# Patient Record
Sex: Female | Born: 1950 | Race: White | Hispanic: No | Smoking: Former smoker
Health system: Southern US, Community
[De-identification: ages and names within clinical notes are randomized; demographics above are authoritative.]

## PROBLEM LIST (undated history)

## (undated) DIAGNOSIS — C3490 Malignant neoplasm of unspecified part of unspecified bronchus or lung: Secondary | ICD-10-CM

## (undated) DIAGNOSIS — I10 Essential (primary) hypertension: Secondary | ICD-10-CM

## (undated) DIAGNOSIS — Z7901 Long term (current) use of anticoagulants: Secondary | ICD-10-CM

## (undated) DIAGNOSIS — R9430 Abnormal result of cardiovascular function study, unspecified: Secondary | ICD-10-CM

## (undated) DIAGNOSIS — J189 Pneumonia, unspecified organism: Secondary | ICD-10-CM

## (undated) DIAGNOSIS — J449 Chronic obstructive pulmonary disease, unspecified: Secondary | ICD-10-CM

## (undated) DIAGNOSIS — T7840XA Allergy, unspecified, initial encounter: Secondary | ICD-10-CM

## (undated) DIAGNOSIS — R918 Other nonspecific abnormal finding of lung field: Secondary | ICD-10-CM

## (undated) DIAGNOSIS — I82409 Acute embolism and thrombosis of unspecified deep veins of unspecified lower extremity: Secondary | ICD-10-CM

## (undated) DIAGNOSIS — I4891 Unspecified atrial fibrillation: Secondary | ICD-10-CM

## (undated) DIAGNOSIS — I2699 Other pulmonary embolism without acute cor pulmonale: Secondary | ICD-10-CM

## (undated) DIAGNOSIS — I313 Pericardial effusion (noninflammatory): Secondary | ICD-10-CM

## (undated) DIAGNOSIS — I3139 Other pericardial effusion (noninflammatory): Secondary | ICD-10-CM

## (undated) DIAGNOSIS — C719 Malignant neoplasm of brain, unspecified: Secondary | ICD-10-CM

## (undated) DIAGNOSIS — C801 Malignant (primary) neoplasm, unspecified: Secondary | ICD-10-CM

## (undated) DIAGNOSIS — J8410 Pulmonary fibrosis, unspecified: Secondary | ICD-10-CM

## (undated) HISTORY — DX: Pericardial effusion (noninflammatory): I31.3

## (undated) HISTORY — DX: Long term (current) use of anticoagulants: Z79.01

## (undated) HISTORY — DX: Malignant (primary) neoplasm, unspecified: C80.1

## (undated) HISTORY — DX: Allergy, unspecified, initial encounter: T78.40XA

## (undated) HISTORY — DX: Malignant neoplasm of brain, unspecified: C71.9

## (undated) HISTORY — PX: CHOLECYSTECTOMY: SHX55

## (undated) HISTORY — DX: Other pericardial effusion (noninflammatory): I31.39

## (undated) HISTORY — PX: APPENDECTOMY: SHX54

## (undated) HISTORY — DX: Malignant neoplasm of unspecified part of unspecified bronchus or lung: C34.90

---

## 2006-09-30 DIAGNOSIS — I4891 Unspecified atrial fibrillation: Secondary | ICD-10-CM

## 2006-09-30 HISTORY — DX: Unspecified atrial fibrillation: I48.91

## 2008-03-21 ENCOUNTER — Ambulatory Visit: Payer: Self-pay | Admitting: Cardiology

## 2008-10-06 ENCOUNTER — Ambulatory Visit: Payer: Self-pay | Admitting: Cardiology

## 2008-10-10 ENCOUNTER — Encounter: Payer: Self-pay | Admitting: Cardiology

## 2008-10-16 ENCOUNTER — Encounter: Payer: Self-pay | Admitting: Cardiology

## 2008-10-28 ENCOUNTER — Ambulatory Visit: Payer: Self-pay | Admitting: Cardiology

## 2008-11-11 ENCOUNTER — Ambulatory Visit: Payer: Self-pay | Admitting: Cardiology

## 2008-11-22 ENCOUNTER — Encounter: Payer: Self-pay | Admitting: Cardiology

## 2008-11-30 ENCOUNTER — Encounter: Payer: Self-pay | Admitting: Cardiology

## 2008-12-05 ENCOUNTER — Encounter: Payer: Self-pay | Admitting: Cardiology

## 2009-01-14 ENCOUNTER — Encounter: Payer: Self-pay | Admitting: Cardiology

## 2009-02-08 ENCOUNTER — Encounter: Payer: Self-pay | Admitting: Cardiology

## 2009-02-12 ENCOUNTER — Ambulatory Visit: Payer: Self-pay | Admitting: Cardiology

## 2009-02-12 ENCOUNTER — Encounter: Payer: Self-pay | Admitting: Cardiology

## 2009-02-17 ENCOUNTER — Encounter: Payer: Self-pay | Admitting: Cardiology

## 2009-02-22 ENCOUNTER — Ambulatory Visit: Payer: Self-pay | Admitting: Cardiology

## 2009-02-22 ENCOUNTER — Encounter: Payer: Self-pay | Admitting: Cardiology

## 2009-03-15 ENCOUNTER — Encounter: Payer: Self-pay | Admitting: Cardiology

## 2009-05-26 ENCOUNTER — Ambulatory Visit: Payer: Self-pay | Admitting: Cardiology

## 2009-08-25 ENCOUNTER — Encounter: Payer: Self-pay | Admitting: Cardiology

## 2009-11-16 ENCOUNTER — Encounter: Payer: Self-pay | Admitting: Cardiology

## 2009-11-23 ENCOUNTER — Ambulatory Visit: Payer: Self-pay | Admitting: Cardiology

## 2009-11-24 ENCOUNTER — Encounter: Payer: Self-pay | Admitting: Cardiology

## 2009-12-01 ENCOUNTER — Ambulatory Visit: Payer: Self-pay | Admitting: Cardiology

## 2009-12-18 ENCOUNTER — Telehealth (INDEPENDENT_AMBULATORY_CARE_PROVIDER_SITE_OTHER): Payer: Self-pay | Admitting: *Deleted

## 2009-12-29 ENCOUNTER — Ambulatory Visit: Payer: Self-pay | Admitting: Cardiology

## 2010-01-16 ENCOUNTER — Ambulatory Visit: Payer: Self-pay | Admitting: Cardiology

## 2010-02-16 ENCOUNTER — Ambulatory Visit: Payer: Self-pay | Admitting: Cardiology

## 2010-03-30 ENCOUNTER — Ambulatory Visit: Payer: Self-pay | Admitting: Cardiology

## 2010-03-30 LAB — CONVERTED CEMR LAB: POC INR: 1.7

## 2010-04-17 ENCOUNTER — Ambulatory Visit: Payer: Self-pay | Admitting: Cardiology

## 2010-04-17 LAB — CONVERTED CEMR LAB: POC INR: 4.8

## 2010-04-28 ENCOUNTER — Encounter: Payer: Self-pay | Admitting: Cardiology

## 2010-05-01 ENCOUNTER — Encounter: Payer: Self-pay | Admitting: Cardiology

## 2010-05-05 ENCOUNTER — Encounter: Payer: Self-pay | Admitting: Cardiology

## 2010-05-08 ENCOUNTER — Ambulatory Visit: Payer: Self-pay | Admitting: Cardiology

## 2010-05-08 LAB — CONVERTED CEMR LAB: POC INR: 1.9

## 2010-05-15 ENCOUNTER — Ambulatory Visit: Payer: Self-pay | Admitting: Cardiology

## 2010-05-15 LAB — CONVERTED CEMR LAB: POC INR: 2.2

## 2010-06-25 ENCOUNTER — Telehealth (INDEPENDENT_AMBULATORY_CARE_PROVIDER_SITE_OTHER): Payer: Self-pay | Admitting: *Deleted

## 2010-07-02 ENCOUNTER — Encounter: Payer: Self-pay | Admitting: Cardiology

## 2010-07-03 ENCOUNTER — Ambulatory Visit: Payer: Self-pay | Admitting: Cardiology

## 2010-07-03 DIAGNOSIS — Z87891 Personal history of nicotine dependence: Secondary | ICD-10-CM

## 2010-07-03 LAB — CONVERTED CEMR LAB: POC INR: 1.8

## 2010-07-27 ENCOUNTER — Ambulatory Visit: Payer: Self-pay | Admitting: Cardiology

## 2010-08-28 ENCOUNTER — Ambulatory Visit: Payer: Self-pay | Admitting: Cardiology

## 2010-09-18 ENCOUNTER — Ambulatory Visit: Payer: Self-pay | Admitting: Cardiology

## 2010-09-18 LAB — CONVERTED CEMR LAB: POC INR: 1.5

## 2010-09-30 DIAGNOSIS — I2699 Other pulmonary embolism without acute cor pulmonale: Secondary | ICD-10-CM

## 2010-09-30 HISTORY — DX: Other pulmonary embolism without acute cor pulmonale: I26.99

## 2010-10-02 ENCOUNTER — Ambulatory Visit: Admission: RE | Admit: 2010-10-02 | Discharge: 2010-10-02 | Payer: Self-pay | Source: Home / Self Care

## 2010-10-02 LAB — CONVERTED CEMR LAB: POC INR: 2.4

## 2010-10-03 ENCOUNTER — Telehealth: Payer: Self-pay | Admitting: Cardiology

## 2010-10-03 ENCOUNTER — Encounter: Payer: Self-pay | Admitting: Cardiology

## 2010-10-19 ENCOUNTER — Ambulatory Visit: Admission: RE | Admit: 2010-10-19 | Discharge: 2010-10-19 | Payer: Self-pay | Source: Home / Self Care

## 2010-10-19 LAB — CONVERTED CEMR LAB: POC INR: 1.5

## 2010-10-30 ENCOUNTER — Ambulatory Visit: Admit: 2010-10-30 | Payer: Self-pay

## 2010-10-30 NOTE — Medication Information (Signed)
Summary: ccr-lr  Anticoagulant Therapy  Managed by: Vashti Hey, RN PCP: Roswell Miners MD: Antoine Poche MD, Fayrene Fearing Indication 1: Atrial Fibrillation Indication 2: H/O Pulm Embolism Lab Used: [facility] [place] Site: [place] INR POC 2.2  Dietary changes: no    Health status changes: no    Bleeding/hemorrhagic complications: no    Recent/future hospitalizations: no    Any changes in medication regimen? no    Recent/future dental: no  Any missed doses?: no       Is patient compliant with meds? yes       Allergies: 1)  ! Codeine 2)  ! Erythromycin 3)  ! * Ambien  Anticoagulation Management History:      The patient is taking warfarin and comes in today for a routine follow up visit.  Negative risk factors for bleeding include an age less than 10 years old.  The bleeding index is 'low risk'.  Positive CHADS2 values include History of HTN.  Negative CHADS2 values include Age > 49 years old.  Anticoagulation responsible provider: Antoine Poche MD, Fayrene Fearing.  INR POC: 2.2.  Cuvette Lot#: [id number].    Anticoagulation Management Assessment/Plan:      The patient's current anticoagulation dose is Warfarin sodium 2 mg tabs: Use as directed by Anticoagualtion Clinic.  The target INR is 2.0-3.0.  The next INR is due 06/07/2010.  Anticoagulation instructions were given to patient.  Results were reviewed/authorized by Vashti Hey, RN.  She was notified by Vashti Hey RN.         Prior Anticoagulation Instructions: INR 1.9 Pt has been taking coumadin 5mg  since d/c from hospital Decrease dose to 2mg  once daily except 4mg  on T,Th,Sat  Current Anticoagulation Instructions: INR 2.2 Continue coumadin 2mg  once daily except 4mg  on T,Th,Sat Will have next INR check at Dr Myrtis Ser appt since she lives in [place]

## 2010-10-30 NOTE — Medication Information (Signed)
Summary: ccr-lr  Anticoagulant Therapy  Managed by: Vashti Hey, RN PCP: Louie Bun Supervising MD: Myrtis Ser MD, Tinnie Gens Indication 1: Atrial Fibrillation Indication 2: H/O Pulm Embolism Lab Used: [facility] [place] Site: [place] INR POC 2.2  Dietary changes: no    Health status changes: no    Bleeding/hemorrhagic complications: no    Recent/future hospitalizations: no    Any changes in medication regimen? no    Recent/future dental: no  Any missed doses?: no       Is patient compliant with meds? yes       Allergies: 1)  ! Codeine 2)  ! Erythromycin 3)  ! * Ambien  Anticoagulation Management History:      The patient is taking warfarin and comes in today for a routine follow up visit.  Negative risk factors for bleeding include an age less than 7 years old.  The bleeding index is 'low risk'.  Positive CHADS2 values include History of HTN.  Negative CHADS2 values include Age > 25 years old.  Anticoagulation responsible provider: Myrtis Ser MD, Tinnie Gens.  INR POC: 2.2.  Cuvette Lot#: [id number].    Anticoagulation Management Assessment/Plan:      The patient's current anticoagulation dose is Warfarin sodium 2 mg tabs: Use as directed by Anticoagualtion Clinic.  The target INR is 2.0-3.0.  The next INR is due 08/28/2010.  Anticoagulation instructions were given to patient.  Results were reviewed/authorized by Vashti Hey, RN.  She was notified by Vashti Hey RN.         Prior Anticoagulation Instructions: INR 1.8 Take coumadin 4mg  tomorrow then resume 2mg  once daily except 4mg  on T,Th,Sat  Current Anticoagulation Instructions: INR 2.2 Continue coumadin 2mg  once daily except 4mg  on T,Th, Sat

## 2010-10-30 NOTE — Progress Notes (Signed)
Summary: CCR  Phone Note Call from Patient Call back at [facility] Phone [phone]   Caller: Patient Reason for Call: Talk to Nurse Summary of Call: LEFT A MESSAGE WANTING [place] TO SCHEDULE HER A CCR. SHE HASNT HAD HER CCR CHECKED WITH [place] SINCE JULY Initial call taken by: Claudette Laws,  June 25, 2010 1:32 PM  Follow-up for Phone Call        Appt made 07/03/10 at 3pm. Follow-up by: Vashti Hey RN,  June 25, 2010 2:42 PM

## 2010-10-30 NOTE — Miscellaneous (Signed)
  Clinical Lists Changes  Observations: Added new observation of PAST MED HX: ATRIAL FIBRILLATION (ICD-427.31)..paroxysmal..Marland KitchenMarland KitchenCoumadin therapy Coumadin therapy HYPERTENSION, UNSPECIFIED (ICD-401.9) COPD.Marland Kitchensevere... CO2 retention... pulmonary fibrosis Diabetes esophageal stricture. Cholecystectomy Abdominal pain.Marland Kitchen after cholecystectomy Prednisone therapy.... in the past Pulmonary embolus... 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 Cholecystectomy Abdominal pain.Marland Kitchen after cholecystectomy Prednisone therapy.... in the past Pulmonary embolus... treated with Coumadin in the past EF  55-60%...2-D echo January, 2010 Pneumonia    hospital..Marland Kitchen7/30/2011

## 2010-10-30 NOTE — Medication Information (Signed)
Summary: ccr-lr  Anticoagulant Therapy  Managed by: Vashti Hey, RN PCP: Roswell Miners MD: Andee Lineman MD, Michelle Piper Indication 1: Atrial Fibrillation Indication 2: H/O Pulm Embolism Lab Used: [facility] [place] Site: [place] INR POC 4.8  Dietary changes: no    Health status changes: no    Bleeding/hemorrhagic complications: no    Recent/future hospitalizations: no    Any changes in medication regimen? no    Recent/future dental: no  Any missed doses?: no       Is patient compliant with meds? yes       Allergies: 1)  ! Codeine 2)  ! Erythromycin 3)  ! * Ambien  Anticoagulation Management History:      The patient is taking warfarin and comes in today for a routine follow up visit.  Negative risk factors for bleeding include an age less than 72 years old.  The bleeding index is 'low risk'.  Positive CHADS2 values include History of HTN.  Negative CHADS2 values include Age > 97 years old.  Anticoagulation responsible provider: Andee Lineman MD, Michelle Piper.  INR POC: 4.8.  Cuvette Lot#: [id number].    Anticoagulation Management Assessment/Plan:      The patient's current anticoagulation dose is Warfarin sodium 2 mg tabs: Use as directed by Anticoagualtion Clinic.  The target INR is 2.0-3.0.  The next INR is due 05/08/2010.  Anticoagulation instructions were given to patient.  Results were reviewed/authorized by Vashti Hey, RN.  She was notified by Vashti Hey RN.         Prior Anticoagulation Instructions: INR 1.7 Take coumadin 6mg  tonight and tomorrow night then resume 4mg  once daily except 2mg  on Sundays and Thursdays  Current Anticoagulation Instructions: INR 4.8 Hold coumadin tonight and tomorrow night then resume 4mg  once daily except 2mg  on Sundays and Thursdays

## 2010-10-30 NOTE — Medication Information (Signed)
Summary: ccr-lr  Anticoagulant Therapy  Managed by: Vashti Hey, RN PCP: Roswell Miners MD: Andee Lineman MD, Michelle Piper Indication 1: Atrial Fibrillation Indication 2: H/O Pulm Embolism Lab Used: [facility] [place] Site: [place] INR POC 1.9  Dietary changes: no    Health status changes: no    Bleeding/hemorrhagic complications: no    Recent/future hospitalizations: yes       Details: in [facility] from 04/28/10 - 05/05/10 for pneumonia  Any changes in medication regimen? yes       Details: On antibiotic 500mg  qid x 7 days  Received Levaquin and IV rocephin in hospital  Recent/future dental: no  Any missed doses?: yes     Details: coumadin was held in the hospital  At discharge she was told to take couamdin 5mg  qd   Is patient compliant with meds? yes       Allergies: 1)  ! Codeine 2)  ! Erythromycin 3)  ! * Ambien  Anticoagulation Management History:      The patient is taking warfarin and comes in today for a routine follow up visit.  Negative risk factors for bleeding include an age less than 41 years old.  The bleeding index is 'low risk'.  Positive CHADS2 values include History of HTN.  Negative CHADS2 values include Age > 54 years old.  Anticoagulation responsible provider: Andee Lineman MD, Michelle Piper.  INR POC: 1.9.  Cuvette Lot#: [id number].    Anticoagulation Management Assessment/Plan:      The patient's current anticoagulation dose is Warfarin sodium 2 mg tabs: Use as directed by Anticoagualtion Clinic.  The target INR is 2.0-3.0.  The next INR is due 05/15/2010.  Anticoagulation instructions were given to patient.  Results were reviewed/authorized by Vashti Hey, RN.  She was notified by Vashti Hey RN.         Prior Anticoagulation Instructions: INR 4.8 Hold coumadin tonight and tomorrow night then resume 4mg  once daily except 2mg  on Sundays and Thursdays  Current Anticoagulation Instructions: INR 1.9 Pt has been taking coumadin 5mg  since d/c from hospital Decrease dose to 2mg  once  daily except 4mg  on T,Th,Sat

## 2010-10-30 NOTE — Letter (Signed)
Summary: Appointment- Rescheduled  [facility] [address]   [place], [place] [address]   Phone: [phone]  Fax: [phone]     November 16, 2009 MRN: [id number]      [facility] [address] [place], [place]  [address]      Dear Ms. Mcghee,   Due to a change in our office schedule, your appointment on  February 25th at 2:30 p.m. must be changed.    Your new appointment will be February 24th at 3:00 p.m.  We look forward to participating in your health care needs.   Please contact us at the number listed above at your earliest convenience if you need to  reschedule this appointment.       Sincerely,  Glass blower/designer

## 2010-10-30 NOTE — Medication Information (Signed)
Summary: ccr-lr  Anticoagulant Therapy  Managed by: Vashti Hey, RN PCP: Roswell Miners MD: Diona Browner MD, Remi Deter Indication 1: Atrial Fibrillation Indication 2: H/O Pulm Embolism Lab Used: [facility] [place] Site: [place] INR POC 3.2  Dietary changes: no    Health status changes: no    Bleeding/hemorrhagic complications: no    Recent/future hospitalizations: no    Any changes in medication regimen? no    Recent/future dental: no  Any missed doses?: no       Is patient compliant with meds? yes       Allergies: 1)  ! Codeine 2)  ! Erythromycin 3)  ! * Ambien  Anticoagulation Management History:      The patient is taking warfarin and comes in today for a routine follow up visit.  Negative risk factors for bleeding include an age less than 64 years old.  The bleeding index is 'low risk'.  Positive CHADS2 values include History of HTN.  Negative CHADS2 values include Age > 49 years old.  Anticoagulation responsible provider: Diona Browner MD, Remi Deter.  INR POC: 3.2.  Cuvette Lot#: [id number].    Anticoagulation Management Assessment/Plan:      The patient's current anticoagulation dose is Warfarin sodium 2 mg tabs: Use as directed by Anticoagualtion Clinic.  The target INR is 2.0-3.0.  The next INR is due 03/16/2010.  Anticoagulation instructions were given to patient.  Results were reviewed/authorized by Vashti Hey, RN.  She was notified by Vashti Hey RN.         Prior Anticoagulation Instructions: INR 2.5 Continue coumadin 4mg  once daily except 2mg  on Sundays and Thursdays  Current Anticoagulation Instructions: INR 3.2 Take coumadin 2 mg tonight then resume 4mg  once daily except 2mg  on Sundays and Thursdays Increase greens

## 2010-10-30 NOTE — Assessment & Plan Note (Signed)
Summary: 6 mo fu per aug reminder/needs INR check-srs   Visit Type:  Follow-up Primary Provider:  Louie Bun  CC:  atrial fibrillation.  History of Present Illness: The patient is seen for followup of atrial fibrillation.  She has severe lung disease.  Unfortunately she has begun to smoke again.  We spent a long time today talking about this.  Is not having any chest pain.  She's not having any significant palpitations.  Preventive Screening-Counseling & Management  Alcohol-Tobacco     Smoking Status: current     Smoking Cessation Counseling: yes     Packs/Day: 1/2 PPD  Current Medications (verified): 1)  Digoxin 0.25 Mg Tabs (Digoxin) .... Take 1 Tablet By Mouth Once A Day 2)  Promethazine Hcl 25 Mg Tabs (Promethazine Hcl) .... Take 1/2 To 1 Tab By Mouth Every 4 - 6 Hours As Needed For Nausea and Vomiting 3)  Warfarin Sodium 2 Mg Tabs (Warfarin Sodium) .... Use As Directed By Anticoagualtion Clinic 4)  Pristiq 50 Mg Xr24h-Tab (Desvenlafaxine Succinate) .... One Tablet Every Morning 5)  Alprazolam 1 Mg Tabs (Alprazolam) .... One Tab Two Times A Day - Three Times A Day 6)  Singulair 10 Mg Tabs (Montelukast Sodium) .... Once Daily 7)  Advair Diskus 250-50 Mcg/dose Aepb (Fluticasone-Salmeterol) .... One Puff Daily 8)  Spiriva Handihaler 18 Mcg Caps (Tiotropium Bromide Monohydrate) .... Once Daily 9)  Cardizem 120 Mg Tabs (Diltiazem Hcl) .... Once Daily 10)  Prevacid 30 Mg Cpdr (Lansoprazole) .... Once Daily 11)  Albuterol Sulfate (5 Mg/ml) 0.5% Nebu (Albuterol Sulfate) .... As Needed 12)  Humalog Pen 100 Unit/ml Soln (Insulin Lispro (Human)) .... 30 Units Two Times A Day and 20 Units At 4 P.m. 13)  O2 .... 2l/min 24/7 14)  Lasix 40 Mg Tabs (Furosemide) .... Take 1 Tablet By Mouth Two Times A Day 15)  Morphine Sulfate 15 Mg Tabs (Morphine Sulfate) .... Take 1 Tablet By Mouth Two Times A Day As Needed Pain  Allergies (verified): 1)  ! Codeine 2)  ! Erythromycin 3)  ! *  Ambien  Comments:  Nurse/Medical Assistant: The patient is currently on medications but does not know the name or dosage at this time. Instructed to contact our office with details. Will update medication list at that time.  Past History:  Past Medical History: ATRIAL FIBRILLATION (ICD-427.31)..paroxysmal..Marland KitchenMarland KitchenCoumadin therapy Coumadin therapy HYPERTENSION, UNSPECIFIED (ICD-401.9) COPD.Marland Kitchensevere... CO2 retention... pulmonary fibrosis Diabetes esophageal stricture. Cholecystectomy Abdominal pain.Marland Kitchen after cholecystectomy Prednisone therapy.... in the past Pulmonary embolus... treated with Coumadin in the past EF  55-60%...2-D echo January, 2010 Pneumonia    hospital..Marland Kitchen7/30/2011  Social History: Smoking Status:  current Packs/Day:  1/2 PPD  Review of Systems       Patient denies fever, chills, headache, sweats, rash, change in vision, change in hearing, chest pain, cough, nausea vomiting, urinary symptoms.  All other systems are reviewed and are negative  Vital Signs:  Patient profile:   61 year old female Height:      60 inches Weight:      97 pounds BMI:     19.01 O2 Sat:      98 % on Room air Pulse rate:   73 / minute BP sitting:   132 / 79  (left arm) Cuff size:   regular  Vitals Entered By: Carlye Grippe (July 03, 2010 3:11 PM)  O2 Flow:  Room air  Physical Exam  General:  Patient is frail.  She is wearing her oxygen Eyes:  no  xanthelasma. Neck:  no jugular venous distention. Lungs:  lung sounds are very distant. Heart:  cardiac exam reveals S1-S2.  No clicks or significant murmurs. Abdomen:  abdomen is soft. Extremities:  no peripheral edema. Psych:  patient is oriented to person time and place.  Affect is normal.   Impression & Recommendations:  Problem # 1:  COUMADIN THERAPY (ICD-V58.61) The patient will continue on Coumadin.  Problem # 2:  ATRIAL FIBRILLATION (ICD-427.31)  Her updated medication list for this problem includes:    Digoxin 0.25 Mg  Tabs (Digoxin) .Marland Kitchen... Take 1 tablet by mouth once a day    Warfarin Sodium 2 Mg Tabs (Warfarin sodium) ..... Use as directed by anticoagualtion clinic EKG is done today and reviewed by me.  She has sinus rhythm.  No change in therapy.  Problem # 3:  HYPERTENSION, UNSPECIFIED (ICD-401.9)  Her updated medication list for this problem includes:    Cardizem 120 Mg Tabs (Diltiazem hcl) ..... Once daily    Lasix 40 Mg Tabs (Furosemide) .Marland Kitchen... Take 1 tablet by mouth two times a day Blood pressure is controlled.  No change in therapy.  Problem # 4:  TOBACCO ABUSE, HX OF (ICD-V15.82) Unfortunately she is smoking.I discussed this with her at great length.  Patient Instructions: 1)  Your physician wants you to follow-up in: 9 months. You will receive a reminder letter in the mail one-two months in advance. If you don't receive a letter, please call our office to schedule the follow-up appointment.  2)  Your physician recommends that you continue on your current medications as directed. Please refer to the Current Medication list given to you today.

## 2010-10-30 NOTE — Medication Information (Signed)
Summary: CCR-  Anticoagulant Therapy  Managed by: Vashti Hey, RN PCP: Roswell Miners MD: Antoine Poche MD, Fayrene Fearing Indication 1: Atrial Fibrillation Indication 2: H/O Pulm Embolism Lab Used: [facility] [place] Site: [place] INR POC 2.5  Dietary changes: no    Health status changes: no    Bleeding/hemorrhagic complications: no    Recent/future hospitalizations: no    Any changes in medication regimen? no    Recent/future dental: no  Any missed doses?: no       Is patient compliant with meds? yes       Allergies: 1)  ! Codeine 2)  ! Erythromycin 3)  ! * Ambien  Anticoagulation Management History:      The patient is taking warfarin and comes in today for a routine follow up visit.  Negative risk factors for bleeding include an age less than 91 years old.  The bleeding index is 'low risk'.  Positive CHADS2 values include History of HTN.  Negative CHADS2 values include Age > 40 years old.  Anticoagulation responsible provider: Antoine Poche MD, Fayrene Fearing.  INR POC: 2.5.  Cuvette Lot#: [id number].    Anticoagulation Management Assessment/Plan:      The patient's current anticoagulation dose is Warfarin sodium 2 mg tabs: Use as directed by Anticoagualtion Clinic.  The target INR is 2.0-3.0.  The next INR is due 02/13/2010.  Anticoagulation instructions were given to patient.  Results were reviewed/authorized by Vashti Hey, RN.  She was notified by Vashti Hey RN.         Prior Anticoagulation Instructions: INR 1.8 Increase dose to 4mg  once daily except 2mg  on Sundays and Thursdays  Current Anticoagulation Instructions: INR 2.5 Continue coumadin 4mg  once daily except 2mg  on Sundays and Thursdays

## 2010-10-30 NOTE — Medication Information (Signed)
Summary: ccr-at Dr Myrtis Ser appt-lr  Anticoagulant Therapy  Managed by: Vashti Hey, RN PCP: Roswell Miners MD: Myrtis Ser MD, Tinnie Gens Indication 1: Atrial Fibrillation Indication 2: H/O Pulm Embolism Lab Used: [facility] [place] Site: [place] INR POC 1.8  Dietary changes: no    Health status changes: no    Bleeding/hemorrhagic complications: no    Recent/future hospitalizations: no    Any changes in medication regimen? no    Recent/future dental: no  Any missed doses?: yes     Details: missed 1-2 doses 2 wks ago  Is patient compliant with meds? yes       Allergies: 1)  ! Codeine 2)  ! Erythromycin 3)  ! * Ambien  Anticoagulation Management History:      The patient is taking warfarin and comes in today for a routine follow up visit.  Negative risk factors for bleeding include an age less than 73 years old.  The bleeding index is 'low risk'.  Positive CHADS2 values include History of HTN.  Negative CHADS2 values include Age > 44 years old.  Anticoagulation responsible provider: Myrtis Ser MD, Tinnie Gens.  INR POC: 1.8.  Cuvette Lot#: [id number].    Anticoagulation Management Assessment/Plan:      The patient's current anticoagulation dose is Warfarin sodium 2 mg tabs: Use as directed by Anticoagualtion Clinic.  The target INR is 2.0-3.0.  The next INR is due 07/27/2010.  Anticoagulation instructions were given to patient.  Results were reviewed/authorized by Vashti Hey, RN.  She was notified by Vashti Hey RN.         Prior Anticoagulation Instructions: INR 2.2 Continue coumadin 2mg  once daily except 4mg  on T,Th,Sat Will have next INR check at Dr Myrtis Ser appt since she lives in [place]  Current Anticoagulation Instructions: INR 1.8 Take coumadin 4mg  tomorrow then resume 2mg  once daily except 4mg  on T,Th,Sat

## 2010-10-30 NOTE — Medication Information (Signed)
Summary: [facility] hospital-lr  Anticoagulant Therapy  Managed by: Vashti Hey, RN PCP: Roswell Miners MD: Diona Browner MD, Remi Deter Indication 1: Atrial Fibrillation Indication 2: H/O Pulm Embolism Lab Used: [facility] [place] Site: [place] INR POC 1.8  Dietary changes: no    Health status changes: no    Bleeding/hemorrhagic complications: no    Recent/future hospitalizations: yes       Details: Was in [place] 5 days   Discharged home on 12/19/09  Had UTI with blood in urine  Any changes in medication regimen? no    Recent/future dental: no  Any missed doses?: no       Is patient compliant with meds? yes       Allergies: 1)  ! Codeine 2)  ! Erythromycin 3)  ! * Ambien  Anticoagulation Management History:      The patient is taking warfarin and comes in today for a routine follow up visit.  Negative risk factors for bleeding include an age less than 64 years old.  The bleeding index is 'low risk'.  Positive CHADS2 values include History of HTN.  Negative CHADS2 values include Age > 82 years old.  Anticoagulation responsible provider: Diona Browner MD, Remi Deter.  INR POC: 1.8.  Cuvette Lot#: [id number].    Anticoagulation Management Assessment/Plan:      The patient's current anticoagulation dose is Warfarin sodium 2 mg tabs: Use as directed by Anticoagualtion Clinic.  The target INR is 2.0-3.0.  The next INR is due 01/12/2010.  Anticoagulation instructions were given to patient.  Results were reviewed/authorized by Vashti Hey, RN.  She was notified by Vashti Hey RN.         Prior Anticoagulation Instructions: INR 2.4 Continue coumadin 4mg  once daily except 2mg  on Sundays, Wednesdays and Fridays  Current Anticoagulation Instructions: INR 1.8 Increase dose to 4mg  once daily except 2mg  on Sundays and Thursdays

## 2010-10-30 NOTE — Medication Information (Signed)
Summary: ccr-lr  Anticoagulant Therapy  Managed by: Vashti Hey, RN PCP: Roswell Miners MD: Antoine Poche MD, Fayrene Fearing Indication 1: Atrial Fibrillation Indication 2: H/O Pulm Embolism Lab Used: [facility] [place] Site: [place] INR POC 1.7  Dietary changes: no    Health status changes: no    Bleeding/hemorrhagic complications: no    Recent/future hospitalizations: no    Any changes in medication regimen? no    Recent/future dental: no  Any missed doses?: no       Is patient compliant with meds? yes      Comments: Denies missing doses  Allergies: 1)  ! Codeine 2)  ! Erythromycin 3)  ! * Ambien  Anticoagulation Management History:      The patient is taking warfarin and comes in today for a routine follow up visit.  Negative risk factors for bleeding include an age less than 16 years old.  The bleeding index is 'low risk'.  Positive CHADS2 values include History of HTN.  Negative CHADS2 values include Age > 44 years old.  Anticoagulation responsible provider: Antoine Poche MD, Fayrene Fearing.  INR POC: 1.7.  Cuvette Lot#: [id number].    Anticoagulation Management Assessment/Plan:      The patient's current anticoagulation dose is Warfarin sodium 2 mg tabs: Use as directed by Anticoagualtion Clinic.  The target INR is 2.0-3.0.  The next INR is due 04/17/2010.  Anticoagulation instructions were given to patient.  Results were reviewed/authorized by Vashti Hey, RN.  She was notified by Vashti Hey RN.         Prior Anticoagulation Instructions: INR 3.2 Take coumadin 2 mg tonight then resume 4mg  once daily except 2mg  on Sundays and Thursdays Increase greens  Current Anticoagulation Instructions: INR 1.7 Take coumadin 6mg  tonight and tomorrow night then resume 4mg  once daily except 2mg  on Sundays and Thursdays

## 2010-10-30 NOTE — Assessment & Plan Note (Signed)
Summary: 6 month fu recv letter vs   Visit Type:  Follow-up Primary Provider:  Linna Darner  CC:  patient fibrillation.  History of Present Illness: Patient is seen for followup of paroxysmal atrial fibrillation.  She has severe COPD.  She is on continuous O2.  She has good LV function area she is on Coumadin for both atrial fib and history of pulmonary embolus in the past.  She has significant pain from disc in her neck.  In the past she has been on pain meds.  She is not having any severe palpitations.  Preventive Screening-Counseling & Management  Alcohol-Tobacco     Smoking Status: quit     Year Started: 25-30      Year Quit: 3 years ago  Current Medications (verified): 1)  Digoxin 0.25 Mg Tabs (Digoxin) .... Take 1 Tablet By Mouth Once A Day 2)  Promethazine Hcl 25 Mg Tabs (Promethazine Hcl) .... Take 1/2 To 1 Tab By Mouth Every 4 - 6 Hours As Needed For Nausea and Vomiting 3)  Warfarin Sodium 4 Mg Tabs (Warfarin Sodium) .... Use As Directed By Anticoagulation Clinic By Dr. Linna Darner 4)  Pristiq 50 Mg Xr24h-Tab (Desvenlafaxine Succinate) .... One Tablet Every Morning 5)  Alprazolam 1 Mg Tabs (Alprazolam) .... One Tab Two Times A Day - Three Times A Day 6)  Singulair 10 Mg Tabs (Montelukast Sodium) .... Once Daily 7)  Advair Diskus 250-50 Mcg/dose Aepb (Fluticasone-Salmeterol) .... One Puff Daily 8)  Spiriva Handihaler 18 Mcg Caps (Tiotropium Bromide Monohydrate) .... Once Daily 9)  Cardizem 120 Mg Tabs (Diltiazem Hcl) .... Once Daily 10)  Prevacid 30 Mg Cpdr (Lansoprazole) .... Once Daily 11)  Albuterol Sulfate (5 Mg/ml) 0.5% Nebu (Albuterol Sulfate) .... As Needed 12)  Humalog Pen 100 Unit/ml Soln (Insulin Lispro (Human)) .... 30 Units Two Times A Day and 20 Units At 4 P.m. 13)  O2 .... 2l/min 24/7 14)  Lasix 40 Mg Tabs (Furosemide) .... Take 1 Tablet By Mouth Two Times A Day  Allergies: 1)  ! Codeine 2)  ! Erythromycin 3)  ! * Ambien  Past History:  Past Medical History: Last  updated: 05/26/2009 ATRIAL FIBRILLATION (ICD-427.31)..paroxysmal..Marland KitchenMarland KitchenCoumadin therapy Coumadin therapy HYPERTENSION, UNSPECIFIED (ICD-401.9) COPD.Marland Kitchensevere... CO2 retention... pulmonary fibrosis Diabetes esophageal stricture. Cholecystectomy Abdominal pain.Marland Kitchen after cholecystectomy Prednisone therapy.... in the past Pulmonary embolus... treated with Coumadin in the past EF  55-60%...2-D echo January, 2010  Social History: Smoking Status:  quit  Review of Systems       Patient denies fever, chills, headache, sweats, rash, change in vision, change in hearing, chest pain.  She does have shortness of breath related to her lung disease.  There is no significant cough.  She is not having any GI or GU symptoms.  All other systems are reviewed and are negative.  Vital Signs:  Patient profile:   60 year old female Height:      60 inches Weight:      134.25 pounds O2 Sat:      94 % on 2 L/min Pulse rate:   84 / minute BP sitting:   119 / 67  (left arm) Cuff size:   regular  Vitals Entered By: Hoover Brunette, LPN (November 23, 2009 3:00 PM)  O2 Flow:  2 L/min CC: patient fibrillation Is Patient Diabetic? Yes Comments routine follow up   Physical Exam  General:  Patient is stable today.  She is uncomfortable due to pain from her neck. Eyes:  no xanthelasma. Nose:  the patient  is wearing nasal O2. Neck:  no jugular venous extension. Lungs:  lungs are clear.  Respiratory effort is unlabored. Heart:  cardiac exam reveals S1 and S2.  No clicks or significant murmurs. Abdomen:  abdomen soft. Extremities:  no peripheral edema. Psych:  patient is oriented to person time and place.  Affect is normal.   Impression & Recommendations:  Problem # 1:  * CERVICAL DISC PAIN WITH RADICULAR PAIN TO THE RIGHT ARM Patient has significant pain.  I do believe it is appropriate to give her some pain medication.  I last her followup with her primary physician in the future.  Problem # 2:  COPD  (ICD-496)  Her updated medication list for this problem includes:    Singulair 10 Mg Tabs (Montelukast sodium) ..... Once daily    Advair Diskus 250-50 Mcg/dose Aepb (Fluticasone-salmeterol) ..... One puff daily    Spiriva Handihaler 18 Mcg Caps (Tiotropium bromide monohydrate) ..... Once daily    Albuterol Sulfate (5 Mg/ml) 0.5% Nebu (Albuterol sulfate) .Marland Kitchen... As needed COPD is severe.  She wears continuous oxygen.  Problem # 3:  COUMADIN THERAPY (ICD-V58.61) Coumadin is continued for her history of atrial fibrillation and pulmonary embolus.  Problem # 4:  HYPERTENSION, UNSPECIFIED (ICD-401.9)  The following medications were removed from the medication list:    Spironolactone 25 Mg Tabs (Spironolactone) .Marland Kitchen... Take one tablet by mouth daily Her updated medication list for this problem includes:    Cardizem 120 Mg Tabs (Diltiazem hcl) ..... Once daily    Lasix 40 Mg Tabs (Furosemide) .Marland Kitchen... Take 1 tablet by mouth two times a day blood pressure is under reasonable control.  No change in therapy.  Patient Instructions: 1)  Your physician wants you to follow-up in: 6 months. You will receive a reminder letter in the mail one-two months in advance. If you don't receive a letter, please call our office to schedule the follow-up appointment. 2)  Handwritten prescription given for Morphine 15mg .  3)  Follow instructions for Coumadin.  Appended Document: 6 month fu recv letter vs Pt asked me how often she should have her coumadin checked. Notified pt we have pt's come at a minimum of every 4 weeks to have their INR checked but this depends on their INR level and other issues that may arise. Pt states she has INR checked by Dr. Linna Darner and is worried about how he does this. She states she may go 2-3 months without having it checked. She states he never calls her to notify her of the INR level and never makes any dosage adjustments. Pt states several times that this scares her and she is not comfortable  with this. Notified pt we would be happy to set her up with our [facility] clinic if she would like. Pt states she would like to have her coumadin managed here. Notified pt that Misty Stanley, our Coumadin nurse is here on Tuesdays and Fridays. Pt's coumadin checked today and she will return on 3/4 for next visit. Pt verbalized understanding.

## 2010-10-30 NOTE — Medication Information (Signed)
Summary: ccr-lr  Anticoagulant Therapy  Managed by: Vashti Hey, RN PCP: Louie Bun Supervising MD: Andee Lineman MD, Michelle Piper Indication 1: Atrial Fibrillation Indication 2: H/O Pulm Embolism Lab Used: [facility] [place] Site: [place] INR POC 1.7  Dietary changes: no    Health status changes: no    Bleeding/hemorrhagic complications: no    Recent/future hospitalizations: no    Any changes in medication regimen? no    Recent/future dental: no  Any missed doses?: no       Is patient compliant with meds? yes       Allergies: 1)  ! Codeine 2)  ! Erythromycin 3)  ! * Ambien  Anticoagulation Management History:      The patient is taking warfarin and comes in today for a routine follow up visit.  Negative risk factors for bleeding include an age less than 53 years old.  The bleeding index is 'low risk'.  Positive CHADS2 values include History of HTN.  Negative CHADS2 values include Age > 39 years old.  Anticoagulation responsible provider: Andee Lineman MD, Michelle Piper.  INR POC: 1.7.  Cuvette Lot#: [id number].    Anticoagulation Management Assessment/Plan:      The patient's current anticoagulation dose is Warfarin sodium 2 mg tabs: Use as directed by Anticoagualtion Clinic.  The target INR is 2.0-3.0.  The next INR is due 09/18/2010.  Anticoagulation instructions were given to patient.  Results were reviewed/authorized by Vashti Hey, RN.  She was notified by Vashti Hey RN.         Prior Anticoagulation Instructions: INR 2.2 Continue coumadin 2mg  once daily except 4mg  on T,Th, Sat  Current Anticoagulation Instructions: INR 1.7 Take coumadin 2 1/2 tablets tonight, 2 tablets tomorrow night then resume 1 tablet once daily except 2 tabets on T,Th,Sat

## 2010-10-30 NOTE — Letter (Signed)
Summary: [facility] D/C DR. [facility]  [facility] D[facility]C DR. [facility]   Imported By: Zachary George 06/07/2010 11:31:10  _____________________________________________________________________  External Attachment:    Type:   Image     Comment:   External Document

## 2010-10-30 NOTE — Medication Information (Signed)
Summary: CCR-PREVIOUSLY MANAGED BY DR. ANWAR-[facility]  Anticoagulant Therapy  Managed by: Vashti Hey, RN PCP: Roswell Miners MD: Andee Lineman MD, Michelle Piper Indication 1: Atrial Fibrillation Indication 2: H/O Pulm Embolism Lab Used: [facility] [place] Site: [place] INR POC 2.4  Dietary changes: no    Health status changes: no    Bleeding/hemorrhagic complications: no    Recent/future hospitalizations: no    Any changes in medication regimen? no    Recent/future dental: no  Any missed doses?: no       Is patient compliant with meds? yes       Allergies: 1)  ! Codeine 2)  ! Erythromycin 3)  ! * Ambien  Anticoagulation Management History:      The patient is taking warfarin and comes in today for a routine follow up visit.  Negative risk factors for bleeding include an age less than 49 years old.  The bleeding index is 'low risk'.  Positive CHADS2 values include History of HTN.  Negative CHADS2 values include Age > 46 years old.  Anticoagulation responsible provider: Andee Lineman MD, Michelle Piper.  INR POC: 2.4.  Cuvette Lot#: [id number].    Anticoagulation Management Assessment/Plan:      The patient's current anticoagulation dose is Warfarin sodium 2 mg tabs: Use as directed by Anticoagualtion Clinic.  The target INR is 2.0-3.0.  The next INR is due 12/19/2009.  Anticoagulation instructions were given to patient.  Results were reviewed/authorized by Vashti Hey, RN.  She was notified by Vashti Hey RN.         Prior Anticoagulation Instructions: INR 1.7 Pt had been taking 2mg  alternating with 4mg  once daily Increase dose to 4mg  once daily except 2mg  on Sundays, Wednesdays and Fridays    Current Anticoagulation Instructions: INR 2.4 Continue coumadin 4mg once daily except 2mg on Sundays, Wednesdays and Fridays Prescriptions: WARFARIN SODIUM 2 MG TABS (WARFARIN SODIUM) Use as directed by Anticoagualtion Clinic  #60 x 3   Entered by:   Lisa Reid RN   Authorized by:   Jeffrey David Katz, MD,  FACC   Signed by:   Lisa Reid RN on 12/01/2009   Method used:   Electronically to        [facility] (retail)       [address]       [place], [place]  [address]       Ph: [phone]       Fax: [phone]   RxID:   [id number]

## 2010-10-30 NOTE — Medication Information (Signed)
Summary: Coumadin Clinic  Anticoagulant Therapy  Managed by: Cyril Loosen, RN PCP: Roswell Miners MD: Myrtis Ser MD, Tinnie Gens Indication 1: Atrial Fibrillation Indication 2: H/O Pulm Embolism Lab Used: [facility] [place] Site: [place] INR POC 1.7  Dietary changes: no    Health status changes: no    Bleeding/hemorrhagic complications: no    Recent/future hospitalizations: no    Any changes in medication regimen? no    Recent/future dental: no  Any missed doses?: no       Is patient compliant with meds? yes      Comments: Pt had been managed by Dr Linna Darner but request to have coumadin managed here now.   Allergies: 1)  ! Codeine 2)  ! Erythromycin 3)  ! * Ambien  Anticoagulation Management History:      The patient is taking warfarin and comes in today for a routine follow up visit.  Negative risk factors for bleeding include an age less than 53 years old.  The bleeding index is 'low risk'.  Positive CHADS2 values include History of HTN.  Negative CHADS2 values include Age > 78 years old.  Anticoagulation responsible provider: Myrtis Ser MD, Tinnie Gens.  INR POC: 1.7.  Cuvette Lot#: [id number].    Anticoagulation Management Assessment/Plan:      The patient's current anticoagulation dose is Warfarin sodium 4 mg tabs: Use as directed by Anticoagulation Clinic by Dr. Linna Darner.  The target INR is 2.0-3.0.  Anticoagulation instructions were given to patient.  Results were reviewed/authorized by Cyril Loosen, RN.  She was notified by Cyril Loosen, RN.         Current Anticoagulation Instructions: INR 1.7 Pt had been taking 2mg  alternating with 4mg  once daily Increase dose to 4mg  once daily except 2mg  on Sundays, Wednesdays and Fridays

## 2010-10-30 NOTE — Progress Notes (Signed)
Summary: wants pain med  Phone Note Call from Patient Call back at [facility] Phone [phone]   Summary of Call: Pt called regarding prescription for Morphine that she was given at last office visit. She thought she had a refill on this. Notified pt that we don't provide refills on pain medications. She states she was given RX for neck pain. Notified pt whoever treats her neck pain (Dr. Linna Darner) will have to provide further prescriptions for pain medications. Pt verbalized understanding.  Initial call taken by: Cyril Loosen, RN, BSN,  December 18, 2009 2:06 PM

## 2010-11-01 NOTE — Medication Information (Signed)
Summary: ccr-lr  Anticoagulant Therapy  Managed by: Vashti Hey, RN PCP: Louie Bun Supervising MD: Diona Browner MD, Remi Deter Indication 1: Atrial Fibrillation Indication 2: H/O Pulm Embolism Lab Used: [facility] [place] Site: [place] INR POC 1.5           Allergies: 1)  ! Codeine 2)  ! Erythromycin 3)  ! * Ambien  Anticoagulation Management History:      Negative risk factors for bleeding include an age less than 59 years old.  The bleeding index is 'low risk'.  Positive CHADS2 values include History of HTN.  Negative CHADS2 values include Age > 35 years old.  Anticoagulation responsible provider: Diona Browner MD, Remi Deter.  INR POC: 1.5.    Anticoagulation Management Assessment/Plan:      The patient's current anticoagulation dose is Warfarin sodium 2 mg tabs: Use as directed by Anticoagualtion Clinic.  The target INR is 2.0-3.0.  The next INR is due 11/02/2010.  Anticoagulation instructions were given to patient.  Results were reviewed/authorized by Vashti Hey, RN.  She was notified by Vashti Hey RN.         Prior Anticoagulation Instructions: Phone call from pt stating she is on antibiotics and prednisone.  Pt to decrease coumadin to 2mg  once daily untill finished with antibiotic and prednisone (1/10) then resume 4mg  once daily except 2,g on M,F and recheck INR on 10/19/10.  Current Anticoagulation Instructions: 1.5 Pt did not resume regular dose of coumadin after antibiotics and prednisone Take couamdin 2 1/2 tablets x 2 then resume 2 tablets once daily except 1 tablet on Mondays and Fridays

## 2010-11-01 NOTE — Progress Notes (Signed)
Summary: coumadin management  Phone Note Call from Patient Call back at Home Phone [phone]   Caller: Patient Call For: Misty Stanley Summary of Call: Patient called to inform coumadin nurse that she saw Dr. Olena Leatherwood yesterday for bad cold and was given the following medications: ceftin 250mg  two times a day ,prednisone 20mg  Take 1 tablet by mouth once a day,& hydromet syrup take 1 tsp every six hours. Please advise if any adjustments should be made with her coumadin.  Initial call taken by: Carlye Grippe,  October 03, 2010 1:13 PM  Follow-up for Phone Call        Spoke with pt.  See coumaidn note. Follow-up by: Vashti Hey RN,  October 03, 2010 2:28 PM      Anticoagulation Management History:      The patient is taking warfarin and comes in today for a routine follow up visit.  Negative risk factors for bleeding include an age less than 11 years old.  The bleeding index is 'low risk'.  Positive CHADS2 values include History of HTN.  Negative CHADS2 values include Age > 43 years old.  Anticoagulation responsible provider: Diona Browner MD, Remi Deter.    Anticoagulation Management Assessment/Plan:      The patient's current anticoagulation dose is Warfarin sodium 2 mg tabs: Use as directed by Anticoagualtion Clinic.  The target INR is 2.0-3.0.  The next INR is due 10/19/2010.  Anticoagulation instructions were given to patient.  Results were reviewed/authorized by Vashti Hey, RN.  She was notified by Vashti Hey RN.         Prior Anticoagulation Instructions: INR 2.4 Continue coumadin 4mg  once daily except 2mg  on Mondays and Fridays  Current Anticoagulation Instructions: Phone call from pt stating she is on antibiotics and prednisone.  Pt to decrease coumadin to 2mg  once daily untill finished with antibiotic and prednisone (1/10) then resume 4mg  once daily except 2,g on M,F and recheck INR on 10/19/10.

## 2010-11-01 NOTE — Medication Information (Signed)
Summary: ccr-lr  Anticoagulant Therapy  Managed by: Kristen Hey, RN PCP: Kristen Keller Supervising MD: Diona Browner MD, Remi Deter Indication 1: Atrial Fibrillation Indication 2: H/O Pulm Embolism Lab Used: [facility] [place] Site: [place] INR POC 2.4  Dietary changes: no    Health status changes: no    Bleeding/hemorrhagic complications: no    Recent/future hospitalizations: no    Any changes in medication regimen? no    Recent/future dental: no  Any missed doses?: no       Is patient compliant with meds? yes       Allergies: 1)  ! Codeine 2)  ! Erythromycin 3)  ! * Ambien  Anticoagulation Management History:      The patient is taking warfarin and comes in today for a routine follow up visit.  Negative risk factors for bleeding include an age less than 4 years old.  The bleeding index is 'low risk'.  Positive CHADS2 values include History of HTN.  Negative CHADS2 values include Age > 59 years old.  Anticoagulation responsible provider: Diona Browner MD, Remi Deter.  INR POC: 2.4.  Cuvette Lot#: [id number].    Anticoagulation Management Assessment/Plan:      The patient's current anticoagulation dose is Warfarin sodium 2 mg tabs: Use as directed by Anticoagualtion Clinic.  The target INR is 2.0-3.0.  The next INR is due 10/30/2010.  Anticoagulation instructions were given to patient.  Results were reviewed/authorized by Kristen Hey, RN.  She was notified by Kristen Hey RN.         Prior Anticoagulation Instructions: INR 1.5 Take coumadin 2 1/2 tablets tonight then increase dose to 2 tablets once daily except 1 tablet on Mondays and Fridays  Current Anticoagulation Instructions: INR 2.4 Continue coumadin 4mg  once daily except 2mg  on Mondays and Fridays

## 2010-11-01 NOTE — Medication Information (Signed)
Summary: ccr-lr  Anticoagulant Therapy  Managed by: Vashti Hey, RN PCP: Louie Bun Supervising MD: Diona Browner MD, Remi Deter Indication 1: Atrial Fibrillation Indication 2: H/O Pulm Embolism Lab Used: [facility] [place] Site: [place] INR POC 1.5  Dietary changes: no    Health status changes: no    Bleeding/hemorrhagic complications: no    Recent/future hospitalizations: no    Any changes in medication regimen? no    Recent/future dental: no  Any missed doses?: no       Is patient compliant with meds? yes       Allergies: 1)  ! Codeine 2)  ! Erythromycin 3)  ! * Ambien  Anticoagulation Management History:      The patient is taking warfarin and comes in today for a routine follow up visit.  Negative risk factors for bleeding include an age less than 59 years old.  The bleeding index is 'low risk'.  Positive CHADS2 values include History of HTN.  Negative CHADS2 values include Age > 59 years old.  Anticoagulation responsible provider: Diona Browner MD, Remi Deter.  INR POC: 1.5.  Cuvette Lot#: [id number].    Anticoagulation Management Assessment/Plan:      The patient's current anticoagulation dose is Warfarin sodium 2 mg tabs: Use as directed by Anticoagualtion Clinic.  The target INR is 2.0-3.0.  The next INR is due 09/18/2010.  Anticoagulation instructions were given to patient.  Results were reviewed/authorized by Vashti Hey, RN.  She was notified by Vashti Hey RN.         Prior Anticoagulation Instructions: INR 1.7 Take coumadin 2 1/2 tablets tonight, 2 tablets tomorrow night then resume 1 tablet once daily except 2 tabets on T,Th,Sat  Current Anticoagulation Instructions: INR 1.5 Take coumadin 2 1/2 tablets tonight then increase dose to 2 tablets once daily except 1 tablet on Mondays and Fridays Prescriptions: WARFARIN SODIUM 2 MG TABS (WARFARIN SODIUM) Use as directed by Anticoagualtion Clinic  #60 x 6   Entered by:   Vashti Hey RN   Authorized by:   Talitha Givens, MD, [facility]   Signed by:   Vashti Hey RN on 09/18/2010   Method used:   Electronically to        [facility] (retail[facility]       [address]       [place], [place]  [address]       Ph: [phone]       Fax: [phone]   RxID:   [id number]

## 2010-11-02 ENCOUNTER — Encounter (INDEPENDENT_AMBULATORY_CARE_PROVIDER_SITE_OTHER): Payer: Medicare Other

## 2010-11-02 ENCOUNTER — Ambulatory Visit: Admit: 2010-11-02 | Payer: Self-pay

## 2010-11-02 ENCOUNTER — Encounter: Payer: Self-pay | Admitting: Cardiology

## 2010-11-02 DIAGNOSIS — I2699 Other pulmonary embolism without acute cor pulmonale: Secondary | ICD-10-CM

## 2010-11-02 LAB — CONVERTED CEMR LAB: POC INR: 2.5

## 2010-11-07 NOTE — Medication Information (Signed)
Summary: ccr-ir/fhh  Anticoagulant Therapy  Managed by: Vashti Hey, RN PCP: Louie Bun Supervising MD: Dietrich Pates MD, Molly Maduro Indication 1: Atrial Fibrillation Indication 2: H/O Pulm Embolism Lab Used: [facility] [place] Site: [place] INR POC 2.5  Dietary changes: no    Health status changes: no    Bleeding/hemorrhagic complications: no    Recent/future hospitalizations: no    Any changes in medication regimen? no    Recent/future dental: no  Any missed doses?: no       Is patient compliant with meds? yes       Allergies: 1)  ! Codeine 2)  ! Erythromycin 3)  ! * Ambien  Anticoagulation Management History:      The patient is taking warfarin and comes in today for a routine follow up visit.  Negative risk factors for bleeding include an age less than 67 years old.  The bleeding index is 'low risk'.  Positive CHADS2 values include History of HTN.  Negative CHADS2 values include Age > 9 years old.  Anticoagulation responsible provider: Dietrich Pates MD, Molly Maduro.  INR POC: 2.5.  Cuvette Lot#: [id number].    Anticoagulation Management Assessment/Plan:      The patient's current anticoagulation dose is Warfarin sodium 2 mg tabs: Use as directed by Anticoagualtion Clinic.  The target INR is 2.0-3.0.  The next INR is due 11/23/2010.  Anticoagulation instructions were given to patient.  Results were reviewed/authorized by Vashti Hey, RN.  She was notified by Vashti Hey RN.         Prior Anticoagulation Instructions: 1.5 Pt did not resume regular dose of coumadin after antibiotics and prednisone Take couamdin 2 1/2 tablets x 2 then resume 2 tablets once daily except 1 tablet on Mondays and Fridays  Current Anticoagulation Instructions: INR 2.5 Continue coumadin 4mg  once daily except 2mg  on Mondays and Fridays

## 2010-11-30 ENCOUNTER — Encounter: Payer: Self-pay | Admitting: Cardiology

## 2010-11-30 ENCOUNTER — Encounter (INDEPENDENT_AMBULATORY_CARE_PROVIDER_SITE_OTHER): Payer: Medicare Other

## 2010-11-30 DIAGNOSIS — I4891 Unspecified atrial fibrillation: Secondary | ICD-10-CM

## 2010-11-30 DIAGNOSIS — Z7901 Long term (current) use of anticoagulants: Secondary | ICD-10-CM

## 2010-12-06 NOTE — Medication Information (Signed)
Summary: ccr-lr  Anticoagulant Therapy  Managed by: Kristen Hey, RN PCP: Louie Bun Supervising MD: Andee Lineman MD, Michelle Piper Indication 1: Atrial Fibrillation Indication 2: H/O Pulm Embolism Lab Used: [facility] [place] Site: [place] INR POC 2.6  Dietary changes: no    Health status changes: no    Bleeding/hemorrhagic complications: no    Recent/future hospitalizations: no    Any changes in medication regimen? no    Recent/future dental: no  Any missed doses?: no       Is patient compliant with meds? yes       Allergies: 1)  ! Codeine 2)  ! Erythromycin 3)  ! * Ambien  Anticoagulation Management History:      The patient is taking warfarin and comes in today for a routine follow up visit.  Negative risk factors for bleeding include an age less than 75 years old.  The bleeding index is 'low risk'.  Positive CHADS2 values include History of HTN.  Negative CHADS2 values include Age > 43 years old.  Anticoagulation responsible provider: Andee Lineman MD, Michelle Piper.  INR POC: 2.6.  Cuvette Lot#: [id number].    Anticoagulation Management Assessment/Plan:      The patient's current anticoagulation dose is Warfarin sodium 2 mg tabs: Use as directed by Anticoagualtion Clinic.  The target INR is 2.0-3.0.  The next INR is due 11/23/2010.  Anticoagulation instructions were given to patient.  Results were reviewed/authorized by Kristen Hey, RN.  She was notified by Kristen Hey RN.         Prior Anticoagulation Instructions: INR 2.5 Continue coumadin 4mg  once daily except 2mg  on Mondays and Fridays Prescriptions: WARFARIN SODIUM 2 MG TABS (WARFARIN SODIUM) Use as directed by Anticoagualtion Clinic  #90 x 3   Entered by:   Kristen Hey RN   Authorized by:   Talitha Givens, MD, [facility]   Signed by:   Kristen Hey RN on 11/30/2010   Method used:   Faxed to ...       [facility] (retail)       [address]       [place], [place]  [address]       Ph: [phone]       Fax: [phone]   RxID:    [id number]

## 2010-12-22 ENCOUNTER — Encounter: Payer: Self-pay | Admitting: Cardiology

## 2010-12-22 DIAGNOSIS — Z7901 Long term (current) use of anticoagulants: Secondary | ICD-10-CM

## 2010-12-22 DIAGNOSIS — I4891 Unspecified atrial fibrillation: Secondary | ICD-10-CM

## 2010-12-28 ENCOUNTER — Encounter: Payer: Medicare Other | Admitting: *Deleted

## 2011-01-14 ENCOUNTER — Encounter: Payer: Self-pay | Admitting: Cardiology

## 2011-01-15 ENCOUNTER — Ambulatory Visit (INDEPENDENT_AMBULATORY_CARE_PROVIDER_SITE_OTHER): Payer: Medicare Other | Admitting: *Deleted

## 2011-01-15 DIAGNOSIS — I4891 Unspecified atrial fibrillation: Secondary | ICD-10-CM

## 2011-01-15 DIAGNOSIS — Z7901 Long term (current) use of anticoagulants: Secondary | ICD-10-CM

## 2011-02-12 ENCOUNTER — Ambulatory Visit (INDEPENDENT_AMBULATORY_CARE_PROVIDER_SITE_OTHER): Payer: Medicare Other | Admitting: *Deleted

## 2011-02-12 DIAGNOSIS — I4891 Unspecified atrial fibrillation: Secondary | ICD-10-CM

## 2011-02-12 DIAGNOSIS — Z7901 Long term (current) use of anticoagulants: Secondary | ICD-10-CM

## 2011-02-12 NOTE — Assessment & Plan Note (Signed)
[facility]                          [facility] NOTE   NAME:Kristen Keller, Kristen Keller           MRN:          [id number]  DATE:10/28/2008                            DOB:          12-12-1950    Kristen Keller referred by Dr. Linna Darner.  She is a complicated medical  patient.  She is referred to the evaluation of chest pain and rapid  paroxysmal atrial fibrillation.  The patient has severe pulmonary  fibrosis by history and she has on home O2.  She is from [place].  She  is followed by Dr. Orson Aloe for her pulmonary disease.  She is on  Coumadin for her prior pulmonary embolus and also for paroxysmal atrial  fibrillation.  Recently, she was hospitalized at [facility].  During that  time, she had a burst of supraventricular tachycardia that was probably  rapid atrial fibrillation.  She converted to sinus rhythm.  She had some  marked chest pain during that admission.  It may have been during the  time of her rapid arrhythmia.  Her troponins were negative.  Her meds  were adjusted appropriately by Dr. Linna Darner and she was discharged home.  Since being at home, she has had some chest pain.  She also has some  palpitations.  Her blood pressure cuff at home measures the possibility  of heart rates up into the 170s.  I cannot be sure if this is a real  reading or not.  We will have to assess this further.   The patient has an esophageal stricture.  This needs to be dilated at  some point, but this is elective through Dr. Linna Darner.   The patient is here today and she is here with a friend.   PAST MEDICAL HISTORY:   ALLERGIES:  E-MYCIN and CODEINE.   MEDICATIONS:  Ranitidine, theophylline, Coumadin, prednisone, Levaquin,  Xanax, Singulair, spironolactone, Advair, Spiriva, albuterol, morphine,  diltiazem, metformin, and Prevacid.   OTHER MEDICAL PROBLEMS:  See the list below.   REVIEW OF SYSTEMS:  Today, she is not having any GI symptoms.  Although,  she does have some difficulty with swallowing from time to time.  She  has no GU symptoms.  There are no fevers or chills.  She has no  headaches.  There are no major skin rashes.  Otherwise, her review of  systems is negative.   PHYSICAL EXAMINATION:  GENERAL:  The patient is cushingoid.  She is  wearing her home O2.  VITAL SIGNS:  Blood pressure is 139/82 with a pulse of 100.  NEUROLOGIC:  The patient is oriented to person, time, and place.  Affect  is normal.  HEENT:  No xanthelasma.  She has normal extraocular motion.  NECK:  There are no carotid bruits.  There is no jugular venous  distention.  LUNGS:  Decreased breath sounds.  There is no respiratory distress.  CARDIAC:  An S1 with an S2.  There are no clicks or significant murmurs.  ABDOMEN:  Soft.  EXTREMITIES:  She has no peripheral edema.   EKGs are given to me from the hospital.  The copy is difficult to  interpret.  It appears  that there may be atrial fib at a slow rate and  at other times there is atrial fib with a rapid rate.   Two-dimensional echo was done in the hospital.  Study reveals mild  concentric left ventricular hypertrophy.  Ejection fraction is 55-60%.  Right ventricular size and function are said to be normal.  There is a  trivial pericardial effusion.   PROBLEMS:  1. Esophageal stricture.  This needs a further follow up through Dr.      Linna Darner and he is looking into this.  2. Prednisone therapy.  3. Severe lung disease with home oxygen and pulmonary fibrosis by      history.  4. History of pulmonary embolus in the past with the patient on      Coumadin.  5. Paroxysmal atrial fibrillation.  6. Episode of chest pain in the hospital that may have been related to      her atrial fibrillation.   With the patient's severe lung disease in the medications she is on, the  only type of ischemic testing we could do would be with a dobutamine  stress nuclear scan.  I am hesitant to do this knowing that she is   having burst of rapid atrial fibrillation.  We will start first with a  48-hour Holter to get a better feel for what her rhythm is at home.  I  will then see her back for followup.  I will after that make a decision  about adding digoxin to her medicines.  We will then look into further  ischemic testing.     Luis Abed, MD, [facility]  Electronically Signed    JDK/MedQ  DD: 10/28/2008  DT: 10/29/2008  Job #: [id number]   cc:   Erasmo Downer, MD  Dr. Orson Aloe

## 2011-02-12 NOTE — Assessment & Plan Note (Signed)
[facility]                          [facility] NOTE   NAME:Ziff, ANNIKA SELKE           MRN:          [id number]  DATE:11/11/2008                            DOB:          06-20-1951    Kristen Keller is here for followup.  See my complete note of October 28, 2008.  The patient has severe lung disease.  She is being evaluated and  treated by Dr. Cherie Ouch.  She uses home O2.  In the hospital,  she had a burst of rapid atrial fibrillation.  She had chest pain with  this.  When I saw her last, we decided to have her wear a 48-hour  monitor to assess her heart beat at home.  Her heart rate ranges during  the day significantly.  While sleeping, her average heart rate was in  the range of 70.  There are some hours during the day when she is up and  around and the heart rate is as high has 110 average.  She is not having  any chest pain with this.   PAST MEDICAL HISTORY:  Allergies E-MYCIN and CODEINE.   MEDICATIONS:  See the flow sheet.   REVIEW OF SYSTEMS:  She is not having any GU symptoms.  She has no  fevers or chills.  There are no headache.  There is no skin rash.  She  has no eye problems or dental problems.  Otherwise, her review of  systems is negative.   PHYSICAL EXAMINATION:  VITAL SIGNS:  Blood pressure today is 133/75.  Her heart rate is 76.  GENERAL:  The patient is oriented to person, time and place.  Affect is  normal.  She is here with her friend.  She is wearing her portable  oxygen.  HEENT:  Reveals no xanthelasma.  She has normal extraocular motion.  She  is cushingoid in appearance.  NECK:  Exam reveals no bruits.  There is no jugular venous distention.  LUNGS:  Reveal distant decreased breath sounds.  She has no respiratory  distress.  CARDIAC:  Exam reveals S1, S2.  There are no clicks or significant  murmurs.  ABDOMEN:  Her abdomen is soft.  EXTREMITIES:  She has no significant peripheral  edema.   The patient wore a 48-hour monitor.  I have personally reviewed it.  She  has no definite supraventricular arrhythmias or ventricular arrhythmias.  She has marked change over today in her sinus rhythm.  In the middle of  the night when resting, her average heart rate is in the range of 70.  During the daytime hours when she is up and active, her heart rate can  vary substantially with an average rate as high as 120 during some  hours.   PROBLEMS:  1. Esophageal stricture.  This is being followed by Dr. Linna Darner.  2. Prednisone therapy.  Dr. Orson Aloe is overseeing this.  3. Severe lung disease with home O2 and pulmonary fibrosis.  4. History of a pulmonary embolus in the past.  The patient is on      Coumadin.  5. Paroxysmal atrial fibrillation.  Her monitor showed only sinus  and      sinus tachycardia.  We do know that she has had a burst of rapid      atrial fibrillation.  She is on Coumadin.  The patient is on low-      dose diltiazem.  I would like for her to be on a beta-blocker, but      this cannot be done with her lung disease.  There was some chest      pain during her hospitalization when she had rapid atrial      fibrillation.  There was no MI.  6. Normal LV function by recent echo.  7. Question of cushingoid appearance.  Dr. Linna Darner tells me that this      may have predated any steroids.  I do not know if this appearance      is related to steroids or possibly her lung disease.   I have had a long discussion with the patient about the addition of  digoxin.  In her case, I think this would be appropriate.  She has good  LV function.  However, she does poorly when she has the rare burst of  atrial fibrillation.  I am hesitant to push Cardizem.  I think it will  be good for her to have the digoxin on board for the possibility of  rapid heart rates with a burst of atrial fib.  She is in agreement, and  we will start digoxin.  Her BUN is 11 and creatinine 0.8.  She should  be  able to tolerate a standard dose.     Luis Abed, MD, [facility]  Electronically Signed    JDK/MedQ  DD: 11/11/2008  DT: 11/12/2008  Job #: [id number]   cc:   Erasmo Downer, MD

## 2011-02-12 NOTE — Assessment & Plan Note (Signed)
[facility]                          [facility] NOTE   NAME:Zilberman, DEL WISEMAN           MRN:          [id number]  DATE:02/22/2009                            DOB:          1950-10-03    Ms. Gidley was recently in the hospital.  I saw her in consultation on  Feb 14, 2009.  She need a gallbladder surgery.  I reviewed all of her  data and cleared her for this.  She had the surgery successfully and she  was discharged.  She is back in the office today for Cardiology  followup.  Her cardiac status is stable.  She has severe COPD with  pulmonary fibrosis.  She is having some abdominal discomfort.   PAST MEDICAL HISTORY:   ALLERGIES:  See the flow sheet.   MEDICATIONS:  We are currently looking for her discharge medications  from the hospital.  She does not know her medicines at this time.   REVIEW OF SYSTEMS:  There are no fevers or chills.  She has no rashes.  She does have some nausea.  There is no chest pain.  All other systems  are reviewed and are negative.   PHYSICAL EXAMINATION:  VITAL SIGNS:  Blood pressure is 130/80.  Her  heart rate is increased.  The rate is approximately 110.  EKG has been  done and it is sinus rhythm.  GENERAL:  She is lying flat wearing her home O2.  HEENT:  No xanthelasma.  She has normal extraocular motion.  NECK:  There are no carotid bruits.  There is no jugular venous  distention.  LUNGS:  Distant lung sounds.  CARDIAC:  S1 with an S2 and distant heart sounds.  ABDOMEN:  Soft.  EXTREMITIES:  She has no significant peripheral edema.   EKG does show sinus tachycardia at a rate of approximately 110.   PROBLEMS:  #1.  Chronic obstructive pulmonary disease with pulmonary  fibrosis and significant ongoing pulmonary disease with carbon dioxide  retention home oxygen.  #2.  Abdominal pain that was thought to be from cholecystitis.  She had  her gallbladder out.  This is stabilized.  However, she  does have some  nausea.  I will be in touch with Dr. Linna Darner.  #3.  Diabetes.  #4.  Hypertension.  #5.  History of esophageal stricture.  #6.  History of prednisone in the past.  #7.  History of pulmonary embolus treated with Coumadin in the past.  #8.  Coumadin therapy.  #9.  Paroxysmal atrial fibrillation.  She is holding sinus rhythm.  She  does have some sinus tachycardia today.  I suspect this is due to her  feeling poorly with her nausea today.  #10.  Normal left ventricular function by recent echo.   I believe her cardiac status is stable.  However, we will be in touch  with Dr. Linna Darner to see her very soon to follow up with her other issues.     Luis Abed, MD, [facility]  Electronically Signed    JDK/MedQ  DD: 02/22/2009  DT: 02/23/2009  Job #: [id number]   cc:   Erasmo Downer, MD

## 2011-02-20 ENCOUNTER — Other Ambulatory Visit: Payer: Self-pay | Admitting: *Deleted

## 2011-02-20 MED ORDER — DIGOXIN 250 MCG PO TABS: 250.0000 ug | ORAL_TABLET | Freq: Every day | ORAL | Status: AC

## 2011-03-12 ENCOUNTER — Encounter: Payer: Medicare Other | Admitting: *Deleted

## 2011-03-19 ENCOUNTER — Ambulatory Visit (INDEPENDENT_AMBULATORY_CARE_PROVIDER_SITE_OTHER): Payer: Medicare Other | Admitting: *Deleted

## 2011-03-19 DIAGNOSIS — I4891 Unspecified atrial fibrillation: Secondary | ICD-10-CM

## 2011-03-19 DIAGNOSIS — Z7901 Long term (current) use of anticoagulants: Secondary | ICD-10-CM

## 2011-04-16 ENCOUNTER — Ambulatory Visit (INDEPENDENT_AMBULATORY_CARE_PROVIDER_SITE_OTHER): Payer: Medicare Other | Admitting: *Deleted

## 2011-04-16 DIAGNOSIS — I4891 Unspecified atrial fibrillation: Secondary | ICD-10-CM

## 2011-04-16 DIAGNOSIS — Z7901 Long term (current) use of anticoagulants: Secondary | ICD-10-CM

## 2011-05-14 ENCOUNTER — Encounter: Payer: Medicare Other | Admitting: *Deleted

## 2011-05-17 ENCOUNTER — Ambulatory Visit (INDEPENDENT_AMBULATORY_CARE_PROVIDER_SITE_OTHER): Payer: Medicare Other | Admitting: *Deleted

## 2011-05-17 DIAGNOSIS — I4891 Unspecified atrial fibrillation: Secondary | ICD-10-CM

## 2011-05-17 DIAGNOSIS — Z7901 Long term (current) use of anticoagulants: Secondary | ICD-10-CM

## 2011-05-17 LAB — POCT INR: INR: 2.9

## 2011-05-17 MED ORDER — WARFARIN SODIUM 2 MG PO TABS: ORAL_TABLET | ORAL | Status: DC

## 2011-06-18 ENCOUNTER — Encounter: Payer: Medicare Other | Admitting: *Deleted

## 2011-06-25 ENCOUNTER — Encounter: Payer: Medicare Other | Admitting: *Deleted

## 2011-06-25 ENCOUNTER — Encounter: Payer: Self-pay | Admitting: *Deleted

## 2011-06-28 ENCOUNTER — Other Ambulatory Visit: Payer: Self-pay | Admitting: *Deleted

## 2011-06-28 MED ORDER — WARFARIN SODIUM 2 MG PO TABS: ORAL_TABLET | ORAL | Status: DC

## 2011-07-09 ENCOUNTER — Ambulatory Visit (INDEPENDENT_AMBULATORY_CARE_PROVIDER_SITE_OTHER): Payer: Medicare Other | Admitting: *Deleted

## 2011-07-09 DIAGNOSIS — Z7901 Long term (current) use of anticoagulants: Secondary | ICD-10-CM

## 2011-07-09 DIAGNOSIS — I4891 Unspecified atrial fibrillation: Secondary | ICD-10-CM

## 2011-07-09 LAB — POCT INR: INR: 6

## 2011-07-12 ENCOUNTER — Ambulatory Visit (INDEPENDENT_AMBULATORY_CARE_PROVIDER_SITE_OTHER): Payer: Self-pay | Admitting: *Deleted

## 2011-07-12 DIAGNOSIS — R0989 Other specified symptoms and signs involving the circulatory and respiratory systems: Secondary | ICD-10-CM

## 2011-07-12 LAB — POCT INR: INR: 1

## 2011-07-16 ENCOUNTER — Ambulatory Visit (INDEPENDENT_AMBULATORY_CARE_PROVIDER_SITE_OTHER): Payer: Self-pay | Admitting: *Deleted

## 2011-07-16 ENCOUNTER — Encounter: Payer: Medicare Other | Admitting: *Deleted

## 2011-07-16 DIAGNOSIS — R0989 Other specified symptoms and signs involving the circulatory and respiratory systems: Secondary | ICD-10-CM

## 2011-07-16 LAB — PROTIME-INR: INR: 1.6 — AB (ref 0.9–1.1)

## 2011-07-19 ENCOUNTER — Ambulatory Visit (INDEPENDENT_AMBULATORY_CARE_PROVIDER_SITE_OTHER): Payer: Medicare Other | Admitting: *Deleted

## 2011-07-19 DIAGNOSIS — Z7901 Long term (current) use of anticoagulants: Secondary | ICD-10-CM

## 2011-07-19 DIAGNOSIS — I4891 Unspecified atrial fibrillation: Secondary | ICD-10-CM

## 2011-07-19 LAB — POCT INR: INR: 2.5

## 2011-08-02 ENCOUNTER — Ambulatory Visit (INDEPENDENT_AMBULATORY_CARE_PROVIDER_SITE_OTHER): Payer: Medicare Other | Admitting: *Deleted

## 2011-08-02 DIAGNOSIS — I4891 Unspecified atrial fibrillation: Secondary | ICD-10-CM

## 2011-08-02 DIAGNOSIS — Z7901 Long term (current) use of anticoagulants: Secondary | ICD-10-CM

## 2011-08-02 NOTE — Patient Instructions (Signed)
Order given to [place] at [facility]

## 2011-08-08 ENCOUNTER — Ambulatory Visit (INDEPENDENT_AMBULATORY_CARE_PROVIDER_SITE_OTHER): Payer: Self-pay | Admitting: *Deleted

## 2011-08-08 DIAGNOSIS — Z7901 Long term (current) use of anticoagulants: Secondary | ICD-10-CM

## 2011-08-08 DIAGNOSIS — R0989 Other specified symptoms and signs involving the circulatory and respiratory systems: Secondary | ICD-10-CM

## 2011-08-08 DIAGNOSIS — I4891 Unspecified atrial fibrillation: Secondary | ICD-10-CM

## 2011-08-12 ENCOUNTER — Ambulatory Visit (INDEPENDENT_AMBULATORY_CARE_PROVIDER_SITE_OTHER): Payer: Self-pay | Admitting: *Deleted

## 2011-08-12 DIAGNOSIS — I4891 Unspecified atrial fibrillation: Secondary | ICD-10-CM

## 2011-08-12 DIAGNOSIS — Z7901 Long term (current) use of anticoagulants: Secondary | ICD-10-CM

## 2011-08-12 DIAGNOSIS — R0989 Other specified symptoms and signs involving the circulatory and respiratory systems: Secondary | ICD-10-CM

## 2011-08-21 ENCOUNTER — Telehealth: Payer: Self-pay | Admitting: *Deleted

## 2011-08-21 NOTE — Telephone Encounter (Signed)
Pt discharged from hospital today on coumadin 5mg  daily.  Order given for Home health to check INR 08/23/11.

## 2011-08-21 NOTE — Telephone Encounter (Signed)
PLEASE CALL BACK ABOUT PATIENT/TMJ

## 2011-08-23 ENCOUNTER — Ambulatory Visit (INDEPENDENT_AMBULATORY_CARE_PROVIDER_SITE_OTHER): Payer: Self-pay | Admitting: *Deleted

## 2011-08-23 DIAGNOSIS — Z7901 Long term (current) use of anticoagulants: Secondary | ICD-10-CM

## 2011-08-23 DIAGNOSIS — R0989 Other specified symptoms and signs involving the circulatory and respiratory systems: Secondary | ICD-10-CM

## 2011-08-23 DIAGNOSIS — I4891 Unspecified atrial fibrillation: Secondary | ICD-10-CM

## 2011-08-23 LAB — POCT INR: INR: 4.1

## 2011-08-27 ENCOUNTER — Ambulatory Visit (INDEPENDENT_AMBULATORY_CARE_PROVIDER_SITE_OTHER): Payer: Medicare Other | Admitting: *Deleted

## 2011-08-27 DIAGNOSIS — Z7901 Long term (current) use of anticoagulants: Secondary | ICD-10-CM

## 2011-08-27 DIAGNOSIS — I4891 Unspecified atrial fibrillation: Secondary | ICD-10-CM

## 2011-09-03 ENCOUNTER — Ambulatory Visit (INDEPENDENT_AMBULATORY_CARE_PROVIDER_SITE_OTHER): Payer: Self-pay | Admitting: *Deleted

## 2011-09-03 DIAGNOSIS — Z7901 Long term (current) use of anticoagulants: Secondary | ICD-10-CM

## 2011-09-03 DIAGNOSIS — I4891 Unspecified atrial fibrillation: Secondary | ICD-10-CM

## 2011-09-03 DIAGNOSIS — R0989 Other specified symptoms and signs involving the circulatory and respiratory systems: Secondary | ICD-10-CM

## 2011-09-05 DIAGNOSIS — I319 Disease of pericardium, unspecified: Secondary | ICD-10-CM

## 2011-09-06 ENCOUNTER — Encounter (HOSPITAL_COMMUNITY): Payer: Self-pay | Admitting: Anesthesiology

## 2011-09-06 ENCOUNTER — Other Ambulatory Visit: Payer: Self-pay | Admitting: Thoracic Surgery (Cardiothoracic Vascular Surgery)

## 2011-09-06 ENCOUNTER — Encounter (HOSPITAL_COMMUNITY): Payer: Self-pay

## 2011-09-06 ENCOUNTER — Encounter (HOSPITAL_COMMUNITY)
Admission: AD | Disposition: A | Discharge status: Home Health | Payer: Self-pay | Source: Other Acute Inpatient Hospital | Attending: Cardiovascular Disease

## 2011-09-06 ENCOUNTER — Inpatient Hospital Stay (HOSPITAL_COMMUNITY)
Admission: AD | Admit: 2011-09-06 | Discharge: 2011-09-19 | DRG: 237 | Disposition: A | Discharge status: Home Health | Payer: Medicare Other | Source: Other Acute Inpatient Hospital | Attending: Internal Medicine | Admitting: Internal Medicine

## 2011-09-06 ENCOUNTER — Inpatient Hospital Stay (HOSPITAL_COMMUNITY): Payer: Medicare Other | Admitting: Anesthesiology

## 2011-09-06 DIAGNOSIS — J440 Chronic obstructive pulmonary disease with acute lower respiratory infection: Secondary | ICD-10-CM | POA: Diagnosis not present

## 2011-09-06 DIAGNOSIS — I4891 Unspecified atrial fibrillation: Secondary | ICD-10-CM

## 2011-09-06 DIAGNOSIS — R0902 Hypoxemia: Secondary | ICD-10-CM

## 2011-09-06 DIAGNOSIS — D649 Anemia, unspecified: Secondary | ICD-10-CM

## 2011-09-06 DIAGNOSIS — R918 Other nonspecific abnormal finding of lung field: Secondary | ICD-10-CM

## 2011-09-06 DIAGNOSIS — I10 Essential (primary) hypertension: Secondary | ICD-10-CM

## 2011-09-06 DIAGNOSIS — D72829 Elevated white blood cell count, unspecified: Secondary | ICD-10-CM | POA: Diagnosis present

## 2011-09-06 DIAGNOSIS — J910 Malignant pleural effusion: Secondary | ICD-10-CM

## 2011-09-06 DIAGNOSIS — J209 Acute bronchitis, unspecified: Secondary | ICD-10-CM | POA: Diagnosis not present

## 2011-09-06 DIAGNOSIS — Z7901 Long term (current) use of anticoagulants: Secondary | ICD-10-CM

## 2011-09-06 DIAGNOSIS — E119 Type 2 diabetes mellitus without complications: Secondary | ICD-10-CM | POA: Diagnosis present

## 2011-09-06 DIAGNOSIS — J449 Chronic obstructive pulmonary disease, unspecified: Secondary | ICD-10-CM

## 2011-09-06 DIAGNOSIS — K5900 Constipation, unspecified: Secondary | ICD-10-CM | POA: Diagnosis not present

## 2011-09-06 DIAGNOSIS — Z86718 Personal history of other venous thrombosis and embolism: Secondary | ICD-10-CM

## 2011-09-06 DIAGNOSIS — R9430 Abnormal result of cardiovascular function study, unspecified: Secondary | ICD-10-CM

## 2011-09-06 DIAGNOSIS — I309 Acute pericarditis, unspecified: Secondary | ICD-10-CM

## 2011-09-06 DIAGNOSIS — E876 Hypokalemia: Secondary | ICD-10-CM | POA: Diagnosis not present

## 2011-09-06 DIAGNOSIS — IMO0002 Reserved for concepts with insufficient information to code with codable children: Secondary | ICD-10-CM

## 2011-09-06 DIAGNOSIS — F411 Generalized anxiety disorder: Secondary | ICD-10-CM | POA: Diagnosis not present

## 2011-09-06 DIAGNOSIS — Z86711 Personal history of pulmonary embolism: Secondary | ICD-10-CM

## 2011-09-06 DIAGNOSIS — C3410 Malignant neoplasm of upper lobe, unspecified bronchus or lung: Secondary | ICD-10-CM | POA: Diagnosis present

## 2011-09-06 DIAGNOSIS — J810 Acute pulmonary edema: Secondary | ICD-10-CM

## 2011-09-06 DIAGNOSIS — J189 Pneumonia, unspecified organism: Secondary | ICD-10-CM

## 2011-09-06 DIAGNOSIS — J4489 Other specified chronic obstructive pulmonary disease: Secondary | ICD-10-CM

## 2011-09-06 DIAGNOSIS — C771 Secondary and unspecified malignant neoplasm of intrathoracic lymph nodes: Secondary | ICD-10-CM | POA: Diagnosis present

## 2011-09-06 DIAGNOSIS — I959 Hypotension, unspecified: Secondary | ICD-10-CM | POA: Diagnosis not present

## 2011-09-06 DIAGNOSIS — R5381 Other malaise: Secondary | ICD-10-CM | POA: Diagnosis present

## 2011-09-06 DIAGNOSIS — I2699 Other pulmonary embolism without acute cor pulmonale: Secondary | ICD-10-CM

## 2011-09-06 DIAGNOSIS — Z87891 Personal history of nicotine dependence: Secondary | ICD-10-CM

## 2011-09-06 DIAGNOSIS — K3184 Gastroparesis: Secondary | ICD-10-CM | POA: Diagnosis present

## 2011-09-06 DIAGNOSIS — Z79899 Other long term (current) drug therapy: Secondary | ICD-10-CM

## 2011-09-06 DIAGNOSIS — J9620 Acute and chronic respiratory failure, unspecified whether with hypoxia or hypercapnia: Secondary | ICD-10-CM | POA: Diagnosis not present

## 2011-09-06 DIAGNOSIS — F29 Unspecified psychosis not due to a substance or known physiological condition: Secondary | ICD-10-CM | POA: Diagnosis not present

## 2011-09-06 DIAGNOSIS — J8410 Pulmonary fibrosis, unspecified: Secondary | ICD-10-CM

## 2011-09-06 DIAGNOSIS — Z9981 Dependence on supplemental oxygen: Secondary | ICD-10-CM

## 2011-09-06 DIAGNOSIS — I82409 Acute embolism and thrombosis of unspecified deep veins of unspecified lower extremity: Secondary | ICD-10-CM

## 2011-09-06 HISTORY — DX: Other nonspecific abnormal finding of lung field: R91.8

## 2011-09-06 HISTORY — DX: Other pulmonary embolism without acute cor pulmonale: I26.99

## 2011-09-06 HISTORY — DX: Essential (primary) hypertension: I10

## 2011-09-06 HISTORY — DX: Unspecified atrial fibrillation: I48.91

## 2011-09-06 HISTORY — DX: Chronic obstructive pulmonary disease, unspecified: J44.9

## 2011-09-06 HISTORY — DX: Abnormal result of cardiovascular function study, unspecified: R94.30

## 2011-09-06 HISTORY — DX: Pulmonary fibrosis, unspecified: J84.10

## 2011-09-06 HISTORY — DX: Acute embolism and thrombosis of unspecified deep veins of unspecified lower extremity: I82.409

## 2011-09-06 HISTORY — PX: PERICARDIAL WINDOW: SHX2213

## 2011-09-06 HISTORY — DX: Pneumonia, unspecified organism: J18.9

## 2011-09-06 LAB — COMPREHENSIVE METABOLIC PANEL
ALT: 1482 U/L — ABNORMAL HIGH (ref 0–35)
AST: 752 U/L — ABNORMAL HIGH (ref 0–37)
Albumin: 2.8 g/dL — ABNORMAL LOW (ref 3.5–5.2)
Alkaline Phosphatase: 75 U/L (ref 39–117)
Chloride: 92 mEq/L — ABNORMAL LOW (ref 96–112)
Potassium: 4 mEq/L (ref 3.5–5.1)
Total Bilirubin: 0.4 mg/dL (ref 0.3–1.2)

## 2011-09-06 LAB — MRSA PCR SCREENING: MRSA by PCR: NEGATIVE

## 2011-09-06 LAB — DIFFERENTIAL
Blasts: 0 %
Metamyelocytes Relative: 0 %
Monocytes Relative: 9 % (ref 3–12)
Myelocytes: 0 %
nRBC: 0 /100 WBC

## 2011-09-06 LAB — GLUCOSE, CAPILLARY
Glucose-Capillary: 100 mg/dL — ABNORMAL HIGH (ref 70–99)
Glucose-Capillary: 113 mg/dL — ABNORMAL HIGH (ref 70–99)

## 2011-09-06 LAB — PROTIME-INR
INR: 2.23 — ABNORMAL HIGH (ref 0.00–1.49)
Prothrombin Time: 25.1 seconds — ABNORMAL HIGH (ref 11.6–15.2)

## 2011-09-06 LAB — CBC
MCH: 26.9 pg (ref 26.0–34.0)
MCV: 89.3 fL (ref 78.0–100.0)
Platelets: 424 10*3/uL — ABNORMAL HIGH (ref 150–400)
RDW: 15.3 % (ref 11.5–15.5)
WBC: 19.8 10*3/uL — ABNORMAL HIGH (ref 4.0–10.5)

## 2011-09-06 LAB — TSH: TSH: 1.345 u[IU]/mL (ref 0.350–4.500)

## 2011-09-06 SURGERY — CREATION, PERICARDIAL WINDOW
Anesthesia: General | Wound class: Clean

## 2011-09-06 MED ORDER — FENTANYL CITRATE 0.05 MG/ML IJ SOLN
INTRAMUSCULAR | Status: DC | PRN
  Administered 2011-09-06 (×4): 50 ug via INTRAVENOUS

## 2011-09-06 MED ORDER — ONDANSETRON HCL 4 MG/2ML IJ SOLN
INTRAMUSCULAR | Status: DC | PRN
  Administered 2011-09-06: 4 mg via INTRAVENOUS

## 2011-09-06 MED ORDER — VENLAFAXINE HCL ER 37.5 MG PO CP24
37.5000 mg | ORAL_CAPSULE | Freq: Every day | ORAL | Status: DC
  Filled 2011-09-06 (×2): qty 1

## 2011-09-06 MED ORDER — NEOSTIGMINE METHYLSULFATE 1 MG/ML IJ SOLN
INTRAMUSCULAR | Status: DC | PRN
  Administered 2011-09-06: 4 mg via INTRAVENOUS

## 2011-09-06 MED ORDER — DEXTROSE 5 % IV SOLN
INTRAVENOUS | Status: AC
  Filled 2011-09-06: qty 50

## 2011-09-06 MED ORDER — CHLORHEXIDINE GLUCONATE 0.12 % MT SOLN
15.0000 mL | Freq: Two times a day (BID) | OROMUCOSAL | Status: DC
  Administered 2011-09-06 – 2011-09-07 (×3): 15 mL via OROMUCOSAL
  Filled 2011-09-06 (×3): qty 15

## 2011-09-06 MED ORDER — ACETAMINOPHEN 325 MG PO TABS: 650.0000 mg | ORAL_TABLET | ORAL | Status: DC | PRN

## 2011-09-06 MED ORDER — ACETAMINOPHEN 650 MG RE SUPP: 650.0000 mg | RECTAL | Status: DC | PRN

## 2011-09-06 MED ORDER — METOCLOPRAMIDE HCL 5 MG PO TABS
5.0000 mg | ORAL_TABLET | Freq: Three times a day (TID) | ORAL | Status: DC
  Administered 2011-09-07 – 2011-09-19 (×46): 5 mg via ORAL
  Filled 2011-09-06 (×57): qty 1

## 2011-09-06 MED ORDER — PROPOFOL 10 MG/ML IV EMUL
INTRAVENOUS | Status: DC | PRN
  Administered 2011-09-06: 120 mg via INTRAVENOUS

## 2011-09-06 MED ORDER — WARFARIN SODIUM 2 MG PO TABS: 2.0000 mg | ORAL_TABLET | Freq: Every day | ORAL | Status: DC

## 2011-09-06 MED ORDER — SODIUM CHLORIDE 0.9 % IV SOLN: INTRAVENOUS | Status: DC

## 2011-09-06 MED ORDER — LEVOFLOXACIN IN D5W 500 MG/100ML IV SOLN
500.0000 mg | INTRAVENOUS | Status: DC
  Administered 2011-09-07: 500 mg via INTRAVENOUS
  Filled 2011-09-06 (×3): qty 100

## 2011-09-06 MED ORDER — METOCLOPRAMIDE HCL 5 MG PO TABS: 5.0000 mg | ORAL_TABLET | Freq: Four times a day (QID) | ORAL | Status: DC

## 2011-09-06 MED ORDER — FLUMAZENIL 0.5 MG/5ML IV SOLN
INTRAVENOUS | Status: DC | PRN
  Administered 2011-09-06 (×2): 0.2 mg via INTRAVENOUS

## 2011-09-06 MED ORDER — LABETALOL HCL 5 MG/ML IV SOLN
INTRAVENOUS | Status: DC | PRN
  Administered 2011-09-06: 10 mg via INTRAVENOUS
  Administered 2011-09-06: 5 mg via INTRAVENOUS

## 2011-09-06 MED ORDER — GLYCOPYRROLATE 0.2 MG/ML IJ SOLN
INTRAMUSCULAR | Status: DC | PRN
  Administered 2011-09-06: .7 mg via INTRAVENOUS

## 2011-09-06 MED ORDER — VENLAFAXINE HCL ER 75 MG PO CP24
75.0000 mg | ORAL_CAPSULE | Freq: Every day | ORAL | Status: DC
  Administered 2011-09-08 – 2011-09-19 (×12): 75 mg via ORAL
  Filled 2011-09-06 (×16): qty 1

## 2011-09-06 MED ORDER — DILTIAZEM HCL ER COATED BEADS 120 MG PO CP24
120.0000 mg | ORAL_CAPSULE | Freq: Every day | ORAL | Status: DC
  Filled 2011-09-06: qty 1

## 2011-09-06 MED ORDER — ALPRAZOLAM 0.5 MG PO TABS
1.0000 mg | ORAL_TABLET | Freq: Three times a day (TID) | ORAL | Status: DC | PRN
  Administered 2011-09-07 – 2011-09-12 (×3): 1 mg via ORAL
  Filled 2011-09-06: qty 2
  Filled 2011-09-06 (×2): qty 1
  Filled 2011-09-06 (×2): qty 2

## 2011-09-06 MED ORDER — PROMETHAZINE HCL 25 MG/ML IJ SOLN: 12.5000 mg | Freq: Four times a day (QID) | INTRAMUSCULAR | Status: DC | PRN

## 2011-09-06 MED ORDER — MONTELUKAST SODIUM 10 MG PO TABS
10.0000 mg | ORAL_TABLET | Freq: Every day | ORAL | Status: DC
  Filled 2011-09-06: qty 1

## 2011-09-06 MED ORDER — CEFUROXIME SODIUM 1.5 G IJ SOLR
1.5000 g | Freq: Three times a day (TID) | INTRAMUSCULAR | Status: DC
  Filled 2011-09-06: qty 1.5

## 2011-09-06 MED ORDER — LORATADINE 10 MG PO TABS
10.0000 mg | ORAL_TABLET | Freq: Every day | ORAL | Status: DC
  Administered 2011-09-08 – 2011-09-19 (×12): 10 mg via ORAL
  Filled 2011-09-06 (×13): qty 1

## 2011-09-06 MED ORDER — MORPHINE SULFATE 4 MG/ML IJ SOLN
4.0000 mg | INTRAMUSCULAR | Status: DC | PRN
  Administered 2011-09-06 (×2): 4 mg via INTRAVENOUS
  Administered 2011-09-07: 2 mg via INTRAVENOUS
  Administered 2011-09-07 (×4): 4 mg via INTRAVENOUS
  Filled 2011-09-06 (×6): qty 1

## 2011-09-06 MED ORDER — ONDANSETRON HCL 4 MG/2ML IJ SOLN: 4.0000 mg | Freq: Four times a day (QID) | INTRAMUSCULAR | Status: DC | PRN

## 2011-09-06 MED ORDER — SODIUM CHLORIDE 0.9 % IJ SOLN
3.0000 mL | INTRAMUSCULAR | Status: DC | PRN
  Administered 2011-09-07: 3 mL via INTRAVENOUS

## 2011-09-06 MED ORDER — WARFARIN SODIUM 2 MG PO TABS
2.0000 mg | ORAL_TABLET | ORAL | Status: DC
  Filled 2011-09-06: qty 1

## 2011-09-06 MED ORDER — LACTATED RINGERS IV SOLN
INTRAVENOUS | Status: DC
  Administered 2011-09-06: 14:00:00 via INTRAVENOUS

## 2011-09-06 MED ORDER — SODIUM CHLORIDE 0.9 % IV SOLN
250.0000 mL | INTRAVENOUS | Status: DC | PRN
  Administered 2011-09-09: 250 mL via INTRAVENOUS

## 2011-09-06 MED ORDER — PHENYLEPHRINE HCL 10 MG/ML IJ SOLN
10.0000 mg | INTRAVENOUS | Status: DC | PRN
  Administered 2011-09-06: 10 ug/min via INTRAVENOUS

## 2011-09-06 MED ORDER — SPIRONOLACTONE 25 MG PO TABS
25.0000 mg | ORAL_TABLET | Freq: Every day | ORAL | Status: DC
  Administered 2011-09-08 – 2011-09-10 (×3): 25 mg via ORAL
  Filled 2011-09-06 (×4): qty 1

## 2011-09-06 MED ORDER — FUROSEMIDE 20 MG PO TABS
20.0000 mg | ORAL_TABLET | Freq: Two times a day (BID) | ORAL | Status: DC
  Filled 2011-09-06 (×3): qty 1

## 2011-09-06 MED ORDER — PANTOPRAZOLE SODIUM 40 MG PO TBEC: 40.0000 mg | DELAYED_RELEASE_TABLET | Freq: Every day | ORAL | Status: DC

## 2011-09-06 MED ORDER — LACTATED RINGERS IV SOLN
INTRAVENOUS | Status: DC | PRN
  Administered 2011-09-06 (×2): via INTRAVENOUS

## 2011-09-06 MED ORDER — HYDROMORPHONE HCL PF 1 MG/ML IJ SOLN: 0.2500 mg | INTRAMUSCULAR | Status: DC | PRN

## 2011-09-06 MED ORDER — MORPHINE SULFATE 2 MG/ML IJ SOLN
2.0000 mg | INTRAMUSCULAR | Status: DC | PRN
  Administered 2011-09-07 – 2011-09-12 (×8): 2 mg via INTRAVENOUS
  Filled 2011-09-06 (×8): qty 1

## 2011-09-06 MED ORDER — ALPRAZOLAM 0.5 MG PO TABS
1.0000 mg | ORAL_TABLET | Freq: Three times a day (TID) | ORAL | Status: DC
  Administered 2011-09-06 – 2011-09-08 (×3): 1 mg via ORAL
  Administered 2011-09-08: 0.5 mg via ORAL
  Administered 2011-09-08: 1 mg via ORAL
  Filled 2011-09-06 (×3): qty 1
  Filled 2011-09-06: qty 2
  Filled 2011-09-06: qty 1
  Filled 2011-09-06: qty 2
  Filled 2011-09-06: qty 1

## 2011-09-06 MED ORDER — WARFARIN SODIUM 4 MG PO TABS
4.0000 mg | ORAL_TABLET | ORAL | Status: DC
  Filled 2011-09-06: qty 1

## 2011-09-06 MED ORDER — KCL IN DEXTROSE-NACL 20-5-0.45 MEQ/L-%-% IV SOLN
INTRAVENOUS | Status: DC
  Administered 2011-09-06: 21:00:00 via INTRAVENOUS
  Filled 2011-09-06 (×2): qty 1000

## 2011-09-06 MED ORDER — BIOTENE DRY MOUTH MT LIQD
15.0000 mL | Freq: Four times a day (QID) | OROMUCOSAL | Status: DC
  Administered 2011-09-06 – 2011-09-08 (×6): 15 mL via OROMUCOSAL

## 2011-09-06 MED ORDER — FLUTICASONE-SALMETEROL 250-50 MCG/DOSE IN AEPB
1.0000 | INHALATION_SPRAY | Freq: Two times a day (BID) | RESPIRATORY_TRACT | Status: DC
  Filled 2011-09-06: qty 14

## 2011-09-06 MED ORDER — ROCURONIUM BROMIDE 100 MG/10ML IV SOLN
INTRAVENOUS | Status: DC | PRN
  Administered 2011-09-06: 40 mg via INTRAVENOUS
  Administered 2011-09-06: 10 mg via INTRAVENOUS

## 2011-09-06 MED ORDER — DEXTROSE 5 % IV SOLN
1.5000 g | Freq: Once | INTRAVENOUS | Status: AC
  Administered 2011-09-06: 1.5 g via INTRAVENOUS
  Filled 2011-09-06: qty 1.5

## 2011-09-06 MED ORDER — PANTOPRAZOLE SODIUM 40 MG PO TBEC
40.0000 mg | DELAYED_RELEASE_TABLET | Freq: Every day | ORAL | Status: DC
  Administered 2011-09-08 – 2011-09-19 (×12): 40 mg via ORAL
  Filled 2011-09-06 (×13): qty 1

## 2011-09-06 MED ORDER — ACETAMINOPHEN 325 MG PO TABS
650.0000 mg | ORAL_TABLET | ORAL | Status: DC | PRN
  Administered 2011-09-13: 650 mg via ORAL

## 2011-09-06 MED ORDER — FLUTICASONE-SALMETEROL 250-50 MCG/DOSE IN AEPB
1.0000 | INHALATION_SPRAY | Freq: Two times a day (BID) | RESPIRATORY_TRACT | Status: DC
  Administered 2011-09-07 – 2011-09-19 (×24): 1 via RESPIRATORY_TRACT
  Filled 2011-09-06 (×2): qty 14

## 2011-09-06 MED ORDER — SODIUM CHLORIDE 0.9 % IJ SOLN
3.0000 mL | Freq: Two times a day (BID) | INTRAMUSCULAR | Status: DC
  Administered 2011-09-06 – 2011-09-07 (×2): 3 mL via INTRAVENOUS
  Administered 2011-09-07: 10:00:00 via INTRAVENOUS
  Administered 2011-09-08 – 2011-09-18 (×20): 3 mL via INTRAVENOUS

## 2011-09-06 MED ORDER — OXYCODONE-ACETAMINOPHEN 5-325 MG PO TABS
1.0000 | ORAL_TABLET | ORAL | Status: DC | PRN
  Administered 2011-09-07 – 2011-09-14 (×13): 1 via ORAL
  Filled 2011-09-06 (×14): qty 1

## 2011-09-06 MED ORDER — TIOTROPIUM BROMIDE MONOHYDRATE 18 MCG IN CAPS: 18.0000 ug | ORAL_CAPSULE | Freq: Every day | RESPIRATORY_TRACT | Status: DC

## 2011-09-06 MED ORDER — MORPHINE SULFATE 4 MG/ML IJ SOLN
INTRAMUSCULAR | Status: AC
  Administered 2011-09-06: 4 mg via INTRAVENOUS
  Filled 2011-09-06: qty 1

## 2011-09-06 MED ORDER — MONTELUKAST SODIUM 10 MG PO TABS
10.0000 mg | ORAL_TABLET | Freq: Every day | ORAL | Status: DC
  Administered 2011-09-07 – 2011-09-18 (×12): 10 mg via ORAL
  Filled 2011-09-06 (×14): qty 1

## 2011-09-06 MED ORDER — DEXTROSE 5 % IV SOLN
1.0000 g | INTRAVENOUS | Status: DC
  Filled 2011-09-06: qty 10

## 2011-09-06 MED ORDER — CEFUROXIME SODIUM 1.5 G IJ SOLR
INTRAMUSCULAR | Status: AC
  Filled 2011-09-06: qty 1.5

## 2011-09-06 MED ORDER — MORPHINE SULFATE 30 MG PO TABS
15.0000 mg | ORAL_TABLET | Freq: Two times a day (BID) | ORAL | Status: DC | PRN
  Administered 2011-09-12 – 2011-09-19 (×4): 15 mg via ORAL
  Filled 2011-09-06 (×4): qty 1

## 2011-09-06 MED ORDER — DIGOXIN 250 MCG PO TABS
250.0000 ug | ORAL_TABLET | Freq: Every day | ORAL | Status: DC
  Filled 2011-09-06: qty 1

## 2011-09-06 MED ORDER — MIDAZOLAM HCL 5 MG/5ML IJ SOLN
INTRAMUSCULAR | Status: DC | PRN
  Administered 2011-09-06: 2 mg via INTRAVENOUS

## 2011-09-06 MED ORDER — NALOXONE HCL 0.4 MG/ML IJ SOLN
INTRAMUSCULAR | Status: DC | PRN
  Administered 2011-09-06 (×4): 0.1 mg via INTRAVENOUS

## 2011-09-06 MED ORDER — TIOTROPIUM BROMIDE MONOHYDRATE 18 MCG IN CAPS
18.0000 ug | ORAL_CAPSULE | Freq: Every day | RESPIRATORY_TRACT | Status: DC
  Administered 2011-09-08 – 2011-09-19 (×12): 18 ug via RESPIRATORY_TRACT
  Filled 2011-09-06 (×2): qty 5

## 2011-09-06 MED ORDER — ALBUTEROL SULFATE (5 MG/ML) 0.5% IN NEBU
2.5000 mg | INHALATION_SOLUTION | Freq: Four times a day (QID) | RESPIRATORY_TRACT | Status: DC | PRN
  Administered 2011-09-07 – 2011-09-14 (×5): 2.5 mg via RESPIRATORY_TRACT
  Filled 2011-09-06 (×6): qty 0.5

## 2011-09-06 SURGICAL SUPPLY — 49 items
BENZOIN TINCTURE PRP APPL 2/3 (GAUZE/BANDAGES/DRESSINGS) IMPLANT
CANISTER SUCTION 2500CC (MISCELLANEOUS) ×2 IMPLANT
CATH THORACIC 28FR (CATHETERS) IMPLANT
CATH THORACIC 28FR RT ANG (CATHETERS) IMPLANT
CATH THORACIC 36FR (CATHETERS) IMPLANT
CATH THORACIC 36FR RT ANG (CATHETERS) IMPLANT
CLOTH BEACON ORANGE TIMEOUT ST (SAFETY) ×2 IMPLANT
CONN ST 1/4X3/8  BEN (MISCELLANEOUS) ×1
CONN ST 1/4X3/8 BEN (MISCELLANEOUS) ×1 IMPLANT
CONT SPEC 4OZ CLIKSEAL STRL BL (MISCELLANEOUS) ×2 IMPLANT
COVER SURGICAL LIGHT HANDLE (MISCELLANEOUS) ×4 IMPLANT
DRAIN CHANNEL 28F RND 3/8 FF (WOUND CARE) ×2 IMPLANT
DRAIN CHANNEL 32F RND 10.7 FF (WOUND CARE) IMPLANT
DRAPE LAPAROSCOPIC ABDOMINAL (DRAPES) ×2 IMPLANT
DRAPE WARM FLUID 44X44 (DRAPE) IMPLANT
ELECT REM PT RETURN 9FT ADLT (ELECTROSURGICAL) ×2
ELECTRODE REM PT RTRN 9FT ADLT (ELECTROSURGICAL) ×1 IMPLANT
GAUZE SPONGE 4X4 16PLY XRAY LF (GAUZE/BANDAGES/DRESSINGS) ×2 IMPLANT
GLOVE EUDERMIC 7 POWDERFREE (GLOVE) ×4 IMPLANT
GOWN ISOLATION IMPERV (GOWNS) ×6 IMPLANT
GOWN PREVENTION PLUS XLARGE (GOWN DISPOSABLE) ×2 IMPLANT
KIT BASIN OR (CUSTOM PROCEDURE TRAY) ×4 IMPLANT
KIT ROOM TURNOVER OR (KITS) ×2 IMPLANT
KIT SUCTION CATH 14FR (SUCTIONS) ×2 IMPLANT
NS IRRIG 1000ML POUR BTL (IV SOLUTION) ×2 IMPLANT
PACK CHEST (CUSTOM PROCEDURE TRAY) ×2 IMPLANT
PAD ARMBOARD 7.5X6 YLW CONV (MISCELLANEOUS) ×4 IMPLANT
PAD ELECT DEFIB RADIOL ZOLL (MISCELLANEOUS) ×2 IMPLANT
SPONGE GAUZE 4X4 12PLY (GAUZE/BANDAGES/DRESSINGS) IMPLANT
SPONGE GAUZE 4X4 STERILE 39 (GAUZE/BANDAGES/DRESSINGS) ×2 IMPLANT
STAPLER VISISTAT 35W (STAPLE) IMPLANT
STRIP CLOSURE SKIN 1/2X4 (GAUZE/BANDAGES/DRESSINGS) ×2 IMPLANT
SUT SILK  1 MH (SUTURE) ×1
SUT SILK 1 MH (SUTURE) ×1 IMPLANT
SUT VIC AB 1 CTX 36 (SUTURE) ×1
SUT VIC AB 1 CTX36XBRD ANBCTR (SUTURE) ×1 IMPLANT
SUT VIC AB 2-0 CTX 36 (SUTURE) ×2 IMPLANT
SUT VIC AB 3-0 X1 27 (SUTURE) ×2 IMPLANT
SWAB COLLECTION DEVICE MRSA (MISCELLANEOUS) IMPLANT
SYR 30ML SLIP (SYRINGE) IMPLANT
SYRINGE 10CC LL (SYRINGE) IMPLANT
SYSTEM SAHARA CHEST DRAIN ATS (WOUND CARE) ×2 IMPLANT
TAPE CLOTH SURG 4X10 WHT LF (GAUZE/BANDAGES/DRESSINGS) ×2 IMPLANT
TOWEL OR 17X24 6PK STRL BLUE (TOWEL DISPOSABLE) ×2 IMPLANT
TOWEL OR 17X26 10 PK STRL BLUE (TOWEL DISPOSABLE) ×4 IMPLANT
TRAP SPECIMEN MUCOUS 40CC (MISCELLANEOUS) ×2 IMPLANT
TRAY FOLEY CATH 14FRSI W/METER (CATHETERS) ×2 IMPLANT
TUBE ANAEROBIC SPECIMEN COL (MISCELLANEOUS) IMPLANT
WATER STERILE IRR 1000ML POUR (IV SOLUTION) ×4 IMPLANT

## 2011-09-06 NOTE — Transfer of Care (Signed)
Immediate Anesthesia Transfer of Care Note  Patient: Kristen Keller  Procedure(s) Performed:  PERICARDIAL WINDOW  Patient Location: PACU  Anesthesia Type: General  Level of Consciousness: awake and lethargic  Airway & Oxygen Therapy: Patient re-intubated and Patient placed on Ventilator (see vital sign flow sheet for setting)  Post-op Assessment: Report given to PACU RN, Post -op Vital signs reviewed and stable and Patient moving all extremities  Post vital signs: Reviewed and stable  Complications: Patient re-intubated

## 2011-09-06 NOTE — H&P (Addendum)
[facility] Dr. Andee Lineman. She has been followed for the last several days at [facility]. Her full cardiology consult note from [facility] will serve as her H&P. This is available in the bedside paper chart and will be scanned into Epic today.   Transferring Dr: Andee Lineman  Primary Cardiologist: Dr. Myrtis Ser  Interim:   Briefly, [name] with history of tobacco abuse, paroxysmal atrial fibrillation, Right LE DVT November 2012, PE on chronic anticoagulation, pulmonary fibrosis, HTN, DM, gastroparesis admitted to [facility] with SOB. 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 She was also found to have pericardial tamponade. An emergent bedside echocardiogram and pericardiocentesis was performed by Dr. Andee Lineman. ~300cc bloody  Fluid was removed by Dr. Andee Lineman. Pathology of fluid is pending. Pt symptomatically better after pericardiocentesis. Her INR was therapeutic on admission so she was given FFP and vitamin K before the pericardiocentesis. INR today is 2.3 at outside hospital. She is known to have normal LV systolic function by echo at [facility] with LVEF of 55-60%.    At the time of arrival to [facility], she is without any complaints. She is hemodynamically stable with HR 91, sinus rhythm. BP 131/59. RR20. O2 sats 96%.   Exam: Alert, oriented. CV : RRR  PULM: clear bilaterally  ABD: soft, BS present   EXT: no edema  CT chest: 09/05/11: 1. 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 Large pericardial effusion.   Echo today: results at [facility] are pending.   Plan:  1. 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 Dr. Andee Lineman has spoken to CT surgery about a possible pericardial window. I will discuss with Dr. Dorris Fetch. This may be delayed given therapeutic INR today and no signs of  tamponade today.   2. Right upper lobe mass with lymph node enlargement c/w metastasis: Will consult Pulmonary to assist in workup.   3. DVT/PE: Her PE was five years ago. Her DVT was 2 weeks ago in the right leg. She will need to be on heparin gtt when INR less than 2.0. Will also need to discuss possibility of an IVC filter if she cannot be anticoagulated following pericardial window. She was therapeutic on coumadin at the time of the DVT last month.   Will repeat labs in our system today. Repeat chest x-ray. Will get results of repeat echo this am at [facility].  Further plans to follow.     Addendum:  I spent 30 minutes of critical care time with this patient.   MCALHANY,CHRISTOPHER 09/10/11 3:27 PM

## 2011-09-06 NOTE — Preoperative (Signed)
Beta Blockers   Reason not to administer Beta Blockers:Pt does not take B-blockers 

## 2011-09-06 NOTE — Progress Notes (Signed)
Apon arrival to PACU Pt sats down from 97 to 84%. Pt taking Small beaths, pt lethargic, Dr Chaney Malling @ bedside. Pt reintubated at 1638 after 160mg  propofol given IV by Dr Chaney Malling. Positive ETCO2 per ez cap, =BBS, RT at bedside placed pt on ventilator, VSS. Aneta Mins, RN in  PACU will continue to monitor.

## 2011-09-06 NOTE — Brief Op Note (Addendum)
09/06/2011  3:41 PM  PATIENT:  Kristen Keller  60 y.o. female  PRE-OPERATIVE DIAGNOSIS:  cardiac tamponade  POST-OPERATIVE DIAGNOSIS:  Pericardial effusion  PROCEDURE:  Procedure(s): SUBXIPHOID PERICARDIAL WINDOW  SURGEON:  Surgeon(s): Loreli Slot, MD  PHYSICIAN ASSISTANT:   ASSISTANTS: Al Corpus   ANESTHESIA:   general  EBL:  Total I/O In: 1739 [P.O.:20; I.V.:1050; Blood:669] Out: -   BLOOD ADMINISTERED:none- 2 units FFP given prior  DRAINS: (28 F) Blake drain(s) in the pericardium   LOCAL MEDICATIONS USED:  NONE  SPECIMEN:  Source of Specimen:  pericardium, pericardial fluid  DISPOSITION OF SPECIMEN:  path and micro  COUNTS:  YES  TOURNIQUET:  * No tourniquets in log *  DICTATION: .Other Dictation: Dictation Number -#[id number]  PLAN OF CARE: Admit to inpatient   PATIENT DISPOSITION:  PACU - hemodynamically stable.   Delay start of Pharmacological VTE agent (>24hrs) due to surgical blood loss or risk of bleeding:  {YES

## 2011-09-06 NOTE — Anesthesia Preprocedure Evaluation (Signed)
Anesthesia Evaluation  Patient identified by MRN, date of birth, ID band Patient awake    Reviewed: Allergy & Precautions, H&P , NPO status , Patient's Chart, lab work & pertinent test results  Airway Mallampati: II  Neck ROM: full    Dental   Pulmonary pneumonia , COPDCurrent Smoker,          Cardiovascular hypertension,     Neuro/Psych    GI/Hepatic   Endo/Other  Diabetes mellitus-  Renal/GU      Musculoskeletal   Abdominal   Peds  Hematology   Anesthesia Other Findings   Reproductive/Obstetrics                           Anesthesia Physical Anesthesia Plan  ASA: III  Anesthesia Plan: General   Post-op Pain Management:    Induction: Intravenous  Airway Management Planned: Oral ETT  Additional Equipment:   Intra-op Plan:   Post-operative Plan: Extubation in OR  Informed Consent: I have reviewed the patients History and Physical, chart, labs and discussed the procedure including the risks, benefits and alternatives for the proposed anesthesia with the patient or authorized representative who has indicated his/her understanding and acceptance.     Plan Discussed with: CRNA and Surgeon  Anesthesia Plan Comments:         Anesthesia Quick Evaluation

## 2011-09-06 NOTE — Plan of Care (Deleted)
Problem: Diagnosis - Type of Surgery Goal: General Surgical Patient Education (See Patient Education module for education specifics)  Outcome: Completed/Met Date Met:  09/06/11 S/p pericardial window placement, CT in place  Comments:  .

## 2011-09-06 NOTE — Consult Note (Signed)
Reason for Consult:pericardial effusion  Referring Physician: Dr. Earnestine Leys   Kristen Keller is an 60 y.o. female.  HPI: 60 yo female recently diagnosed with DVT while on coumadin for a fib. Presented yesterday with progressive SOB, w/u revealed RUL mass with marked adenopathy and a large pericardial effusion. She had tamponade physiology. Pericardiocentesis did relieve the cardiac tamponade but she has a large pericardial effusion primarily posterolateral. Currently breathing improved from admission, but not back to baseline. Daughters state she has not "been the same" since dvt found.  Past Medical History  Diagnosis Date  . COPD (chronic obstructive pulmonary disease)   . Hypertension   . Diabetes mellitus   . Atrial fibrillation 2008  . Pulmonary embolism 2012  . DVT (deep venous thrombosis) unk  . Atrial fibrillation 2008  . Pneumonia   . Pulmonary fibrosis     Past Surgical History  Procedure Date  . Cholecystectomy     No family history on file.  Social History:  reports that she has been smoking.  She does not have any smokeless tobacco history on file. Her alcohol and drug histories not on file.  Allergies:  Allergies  Allergen Reactions  . Codeine     REACTION: "feels like walking outside her body", itching  . Erythromycin     REACTION: hives, sob, diff. breathing  . Zolpidem Tartrate     REACTION: keeps her awake, shaking    Medications:  Prior to Admission:  Prescriptions prior to admission  Medication Sig Dispense Refill  . ADVAIR DISKUS 250-50 MCG/DOSE AEPB Inhale 1 puff into the lungs Twice daily.      Marland Kitchen albuterol (PROVENTIL) (5 MG/ML) 0.5% nebulizer solution Inhale 1 continuous puffing into the lungs as needed. For shortness of breath      . ALPRAZolam (XANAX) 1 MG tablet Take 1 tablet by mouth Three times daily as needed. For anxiety      . digoxin (LANOXIN) 0.25 MG tablet Take 1 tablet (250 mcg total) by mouth daily.  90 tablet  3  . diltiazem (CARDIZEM  CD) 120 MG 24 hr capsule Take 120 mg by mouth daily.        . fexofenadine (ALLEGRA) 180 MG tablet Take 180 mg by mouth daily.        . furosemide (LASIX) 20 MG tablet Take 1 tablet by mouth Twice daily.      . lansoprazole (PREVACID) 30 MG capsule Take 1 capsule by mouth Daily.      . metoCLOPramide (REGLAN) 10 MG tablet Take 5 mg by mouth 4 (four) times daily.        Marland Kitchen oxyCODONE-acetaminophen (PERCOCET) 5-325 MG per tablet Take 1 tablet by mouth 3 (three) times daily as needed. For pain      . PRISTIQ 50 MG 24 hr tablet Take 1 tablet by mouth Daily.      Marland Kitchen SINGULAIR 10 MG tablet Take 1 tablet by mouth Daily.      Marland Kitchen SPIRIVA HANDIHALER 18 MCG inhalation capsule Place 1 puff into inhaler and inhale Daily.      Marland Kitchen spironolactone (ALDACTONE) 25 MG tablet Take 1 tablet by mouth Twice daily.      Marland Kitchen warfarin (COUMADIN) 2 MG tablet Take 2-4 mg by mouth daily. Take 2 tablets daily except 1 tablet on Mondays and Fridays       . DISCONTD: warfarin (COUMADIN) 2 MG tablet Take 2 tablets daily except 1 tablet on Mondays and Fridays  90 tablet  0  .  insulin lispro (HUMALOG) 100 UNIT/ML injection Inject 4 Units into the skin daily as needed. For high blood glucose      . DISCONTD: morphine (MSIR) 15 MG tablet Take 15 mg by mouth 2 (two) times daily as needed. For pain        No results found for this or any previous visit (from the past 48 hour(s)).  No results found.  Review of Systems  Constitutional: Positive for malaise/fatigue. Negative for fever, chills and diaphoresis.  HENT: Negative.   Eyes: Negative.   Respiratory: Positive for cough and shortness of breath. Negative for hemoptysis.   Cardiovascular: Positive for chest pain, orthopnea and PND. Negative for leg swelling (did have with dvt).  Gastrointestinal: Negative.   Genitourinary: Negative.   Skin: Negative.   Neurological: Positive for weakness.   Blood pressure 123/60, pulse 94, temperature 97.3 F (36.3 C), temperature source  Axillary, resp. rate 25, height 4\' 11"  (1.499 m), weight 47.1 kg (103 lb 13.4 oz), SpO2 96.00%. Physical Exam  Vitals reviewed. Constitutional: She is oriented to person, place, and time.       Ill-appearing  HENT:  Head: Normocephalic and atraumatic.  Eyes: EOM are normal. Pupils are equal, round, and reactive to light.  Neck: JVD present. Tracheal deviation present.  Cardiovascular: Normal rate and regular rhythm.   No murmur heard. Respiratory: She has wheezes. She exhibits no tenderness.  GI: Soft.  Musculoskeletal: She exhibits no edema.  Lymphadenopathy:    She has no cervical adenopathy.  Neurological: She is oriented to person, place, and time.  Skin: Skin is warm and dry.    Assessment/Plan: 60 yo female with RUL mass with adenopathy and pericardial effusion. Likely advanced stage lung cancer. 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 She is no longer in tamponade but is at risk for it given the large residual pericardial effusion. She needs a subxiphoid pericardial window to drain the effusion and obtain fluid and tissue for diagnosis.  Her PT remains elevated. She has been partially corrected. Will give 2 more units FFP prior to surgery, then proceed with window.  I have discussed with the patient and her family the general nature of the procedure, need for general anesthesia,and incisions to be used. I have discussed the expected hospital stay, overall recovery and short and long term outcomes. They understand the risks include but are not limited to death, bleeding, possible need for transfusion, infections, and cardiac injury. They understand and accept these risks and agree to proceed. They do understand excellent chance for procedural success but there is a risk of recurrence.  Proceed to OR as soon as possible. Keep NPO  Shelbey Spindler C 09/06/2011, 12:52 PM

## 2011-09-06 NOTE — Consult Note (Addendum)
Name: Kristen Keller MRN: [id number] DOB: 1950-12-09    LOS: 0  PCCM ADMISSION NOTE  History of Present Illness: 60 year old female smoker with PMH of COPD that is O2 dependent who presents to [facility] from [place].  She presented there with SOB, was noted to have an abnormal CXR, CT was done that showed a large pericardial effusion and a pericardiocentesis was done in [facility] hospital.  Patient was then transferred to [facility] for a window and further management.  Patient was admitted by cardiology and PCCM was called on consultation for the lung mass and assistance with IPF management.  Upon interviewing the patient, she has no knowledge of previous pulmonary diagnosis other than COPD.  Denied any fever/chills, N/V, abdominal pain and chest pain.  Lines / Drains: PIV  Cultures: Cytology from pericardial fluid pending.  Antibiotics: Levaquin  Tests / Events: Pericardiocentesis 12/6 Pericardial window 12/7  PMH: COPD HTN Chronic pain Bursitis A-fib on coumadin Multiple bouts of pneumonia  PSH: Appi Cholecystectomy Hysterectomy  Prior to Admission medications   Medication Sig Start Date End Date Taking? Authorizing Provider  ADVAIR DISKUS 250-50 MCG/DOSE AEPB Inhale 1 puff into the lungs Twice daily. 12/21/10  Yes Historical Provider, MD  albuterol (PROVENTIL) (5 MG/ML) 0.5% nebulizer solution Inhale 1 continuous puffing into the lungs as needed. For shortness of breath 11/19/10  Yes Historical Provider, MD  ALPRAZolam Prudy Feeler) 1 MG tablet Take 1 tablet by mouth Three times daily as needed. For anxiety 01/15/11  Yes Historical Provider, MD  digoxin (LANOXIN) 0.25 MG tablet Take 1 tablet (250 mcg total) by mouth daily. 02/20/11  Yes Luis Abed, MD  diltiazem (CARDIZEM CD) 120 MG 24 hr capsule Take 120 mg by mouth daily.     Yes Historical Provider, MD  fexofenadine (ALLEGRA) 180 MG tablet Take 180 mg by mouth daily.     Yes Historical Provider, MD  furosemide (LASIX) 20 MG tablet  Take 1 tablet by mouth Twice daily. 11/13/10  Yes Historical Provider, MD  lansoprazole (PREVACID) 30 MG capsule Take 1 capsule by mouth Daily. 01/05/11  Yes Historical Provider, MD  metoCLOPramide (REGLAN) 10 MG tablet Take 5 mg by mouth 4 (four) times daily.     Yes Historical Provider, MD  oxyCODONE-acetaminophen (PERCOCET) 5-325 MG per tablet Take 1 tablet by mouth 3 (three) times daily as needed. For pain 01/15/11  Yes Historical Provider, MD  PRISTIQ 50 MG 24 hr tablet Take 1 tablet by mouth Daily. 11/28/10  Yes Historical Provider, MD  SINGULAIR 10 MG tablet Take 1 tablet by mouth Daily. 01/01/11  Yes Historical Provider, MD  SPIRIVA HANDIHALER 18 MCG inhalation capsule Place 1 puff into inhaler and inhale Daily. 01/08/11  Yes Historical Provider, MD  spironolactone (ALDACTONE) 25 MG tablet Take 1 tablet by mouth Twice daily. 11/13/10  Yes Historical Provider, MD  warfarin (COUMADIN) 2 MG tablet Take 2-4 mg by mouth daily. Take 2 tablets daily except 1 tablet on Mondays and Fridays  06/28/11  Yes Luis Abed, MD  insulin lispro (HUMALOG) 100 UNIT/ML injection Inject 4 Units into the skin daily as needed. For high blood glucose    Historical Provider, MD    Allergies Allergies  Allergen Reactions  . Codeine     REACTION: "feels like walking outside her body", itching  . Erythromycin     REACTION: hives, sob, diff. breathing  . Zolpidem Tartrate     REACTION: keeps her awake, shaking    Family History Non-contributory  Social History Smoked 2 packs per day from age 79 til age 32, -etoh and -drug abuse.  Review Of Systems  11 points review of systems is negative with an exception of listed in HPI.  Vital Signs: Filed Vitals:   09/06/11 1230  BP: 123/60  Pulse: 94  Temp:   Resp: 25    Intake/Output Summary (Last 24 hours) at 09/06/11 1246 Last data filed at 09/06/11 1200  Gross per 24 hour  Intake     70 ml  Output      0 ml  Net     70 ml    Ventilator settings: Vent  Mode:  [-]  FiO2 (%):  [40 %] 40 %  Physical Examination: General:  Chronically ill appearing female. Neuro:  Lethargic but alert and oriented.   HEENT:  [place]/AT, PERRL, EOM-I, -LAN and -thyromegally. Neck:  Supple   Cardiovascular:  IRIR, Nl S1/S2, -M/R/G. Lungs:  Diffuse end exp wheezes. Abdomen:  Soft, NT, ND and +BS. Musculoskeletal:  -edema and -tenderness  Labs and Imaging:   Labs: CBC No results found for this basename: wbc, rbc, hgb, hct, plt, mcv, mch, mchc, rdw, neutrabs, lymphsabs, monoabs, eosabs, basosabs    BMET No results found for this basename: na, k, cl, co2, glucose, bun, creatinine, calcium, gfrnonaa, gfraa    @cmet @ ABG No results found for this basename: phart, pco2, pco2art, po2, po2art, hco3, tco2, acidbasedef, o2sat    No results found for this basename: MG in the last 168 hours No results found for this basename: CALCIUM, PHOS   Chest CT results noted.  RUL large mass with mediastinal LAN.  Assessment and Plan: 60 year old female with PMH of COPD presenting to the hospital with the CC of SOB and being tired.  Blood pericardial effusion was noted with RUL lung mass.  The patient was seen by pulmonary for COPD and lung mass diagnosis.  The fluid from the pericardial fluid is pending for cytology and is likely to give an answer.  I do not recommend any diagnostic procedures unless the fluid is negative (which is highly unlikely) and until after the tamponade situation has been full addressed.  With regards to COPD I am satisfied with the current treatment regiment.  PCCM will see patient on Monday to evaluate the fluid and decide if a bronchoscopy is necessary.  Patient's daughter requested that we speak with the patient about code status.  After discussion, patient expressed wishes that she be a limited code blue with no CPR or cardioversion and only short term intubation if necessary.  Koren Bound, M.D. Pulmonary and Critical Care Medicine [facility] [phone]  09/06/2011, 12:46 PM

## 2011-09-06 NOTE — Anesthesia Postprocedure Evaluation (Signed)
  Anesthesia Post-op Note  Patient: Kristen Keller  Procedure(s) Performed:  PERICARDIAL WINDOW  Patient Location: PACU  Anesthesia Type: General  Level of Consciousness: awake and sedated  Airway and Oxygen Therapy: Patient re-intubated and Patient placed on Ventilator (see vital sign flow sheet for setting)  Post-op Pain: mild  Post-op Assessment: Post-op Vital signs reviewed, Patient's Cardiovascular Status Stable and Patent Airway  Post-op Vital Signs: Reviewed and stable  Complications: Patient re-intubated

## 2011-09-07 ENCOUNTER — Inpatient Hospital Stay (HOSPITAL_COMMUNITY): Payer: Medicare Other

## 2011-09-07 DIAGNOSIS — Z9911 Dependence on respirator [ventilator] status: Secondary | ICD-10-CM

## 2011-09-07 DIAGNOSIS — R222 Localized swelling, mass and lump, trunk: Secondary | ICD-10-CM

## 2011-09-07 DIAGNOSIS — J9600 Acute respiratory failure, unspecified whether with hypoxia or hypercapnia: Secondary | ICD-10-CM

## 2011-09-07 DIAGNOSIS — I319 Disease of pericardium, unspecified: Secondary | ICD-10-CM

## 2011-09-07 LAB — CBC
HCT: 22.4 % — ABNORMAL LOW (ref 36.0–46.0)
MCH: 26.4 pg (ref 26.0–34.0)
MCHC: 30.3 g/dL (ref 30.0–36.0)
MCV: 88.5 fL (ref 78.0–100.0)
MCV: 87.1 fL (ref 78.0–100.0)
Platelets: 359 10*3/uL (ref 150–400)
Platelets: 337 10*3/uL (ref 150–400)
RBC: 2.53 MIL/uL — ABNORMAL LOW (ref 3.87–5.11)
RDW: 16.3 % — ABNORMAL HIGH (ref 11.5–15.5)
WBC: 17.4 10*3/uL — ABNORMAL HIGH (ref 4.0–10.5)

## 2011-09-07 LAB — LIPID PANEL
HDL: 27 mg/dL — ABNORMAL LOW (ref >39)
Total CHOL/HDL Ratio: 5.9 RATIO

## 2011-09-07 LAB — GLUCOSE, CAPILLARY
Glucose-Capillary: 117 mg/dL — ABNORMAL HIGH (ref 70–99)
Glucose-Capillary: 110 mg/dL — ABNORMAL HIGH (ref 70–99)

## 2011-09-07 LAB — BASIC METABOLIC PANEL
CO2: 33 mEq/L — ABNORMAL HIGH (ref 19–32)
Calcium: 8.4 mg/dL (ref 8.4–10.5)
Chloride: 93 mEq/L — ABNORMAL LOW (ref 96–112)
Potassium: 3.8 mEq/L (ref 3.5–5.1)
Sodium: 135 mEq/L (ref 135–145)

## 2011-09-07 LAB — PREPARE FRESH FROZEN PLASMA: Unit division: 0

## 2011-09-07 MED ORDER — ENOXAPARIN SODIUM 40 MG/0.4ML ~~LOC~~ SOLN
40.0000 mg | SUBCUTANEOUS | Status: DC
  Administered 2011-09-07 – 2011-09-08 (×2): 40 mg via SUBCUTANEOUS
  Filled 2011-09-07 (×3): qty 0.4

## 2011-09-07 MED ORDER — FUROSEMIDE 10 MG/ML IJ SOLN
20.0000 mg | Freq: Once | INTRAMUSCULAR | Status: AC
  Administered 2011-09-07: 20 mg via INTRAVENOUS

## 2011-09-07 MED ORDER — FUROSEMIDE 10 MG/ML IJ SOLN
INTRAMUSCULAR | Status: AC
  Filled 2011-09-07: qty 4

## 2011-09-07 MED ORDER — DIGOXIN 0.25 MG/ML IJ SOLN
0.2500 mg | Freq: Every day | INTRAMUSCULAR | Status: DC
  Administered 2011-09-07 – 2011-09-08 (×2): 0.25 mg via INTRAVENOUS
  Filled 2011-09-07 (×3): qty 1

## 2011-09-07 NOTE — Progress Notes (Signed)
Patient ID: Kristen Keller, female   DOB: 1951-03-06, 60 y.o.   MRN: [id number] SUBJECTIVE: Stable on vent S/P pericardial window. Vent wean today per RT and CCM. No complaints. Annabelle Harman RN with me at bedside. EKG with ST with no acute changes. HBG 6.9 and PRBC's ordered and hanging. No Lasix ordered.  Filed Vitals:   09/07/11 0600 09/07/11 0645 09/07/11 0700 09/07/11 0728  BP:  131/49 136/51 124/47  Pulse: 111 109 108 106  Temp:  99.7 F (37.6 C) 99.3 F (37.4 C)   TempSrc:  Oral Oral   Resp: 19 17 18 16   Height:      Weight:  49.1 kg (108 lb 3.9 oz)    SpO2: 94%   94%    Intake/Output Summary (Last 24 hours) at 09/07/11 0751 Last data filed at 09/07/11 0700  Gross per 24 hour  Intake 3630.25 ml  Output   1438 ml  Net 2192.25 ml    LABS: Basic Metabolic Panel:  Basename 09/07/11 0409 09/06/11 1250  NA 135 136  K 3.8 4.0  CL 93* 92*  CO2 33* 34*  GLUCOSE 107* 98  BUN 10 14  CREATININE 0.42* 0.43*  CALCIUM 8.4 8.9  MG -- 2.0  PHOS -- --   Liver Function Tests:  Basename 09/06/11 1250  AST 752*  ALT 1482*  ALKPHOS 75  BILITOT 0.4  PROT 6.9  ALBUMIN 2.8*   No results found for this basename: LIPASE:2,AMYLASE:2 in the last 72 hours CBC:  Basename 09/07/11 0409 09/06/11 1250  WBC 17.4* 19.8*  NEUTROABS -- 17.2*  HGB 6.9* 7.8*  HCT 22.4* 25.9*  MCV 88.5 89.3  PLT 359 424*   Cardiac Enzymes: No results found for this basename: CKTOTAL:3,CKMB:3,CKMBINDEX:3,TROPONINI:3 in the last 72 hours BNP: No results found for this basename: POCBNP:3 in the last 72 hours D-Dimer: No results found for this basename: DDIMER:2 in the last 72 hours Hemoglobin A1C: No results found for this basename: HGBA1C in the last 72 hours Fasting Lipid Panel:  Basename 09/07/11 0409  CHOL 159  HDL 27*  LDLCALC 100*  TRIG 162*  CHOLHDL 5.9  LDLDIRECT --   Thyroid Function Tests:  Basename 09/06/11 1250  TSH 1.345  T4TOTAL --  T3FREE --  THYROIDAB --   Anemia Panel: No  results found for this basename: VITAMINB12,FOLATE,FERRITIN,TIBC,IRON,RETICCTPCT in the last 72 hours  RADIOLOGY: No results found.  PHYSICAL EXAM General: Well developed, well nourished, in no acute distress, on vent Head: Eyes PERRLA, No xanthomas.   Normal cephalic and atramatic  Lungs: Clear bilaterally to auscultation and percussion. Heart: HRRR S1 S2, with no rub.  Pulses are 2+ & equal.            No carotid bruit. No JVD.  No abdominal bruits. No femoral bruits. Abdomen: Bowel sounds are positive, abdomen soft and non-tender without masses or                  Hernia's noted. Msk:  Back normal, normal gait. Normal strength and tone for age. Extremities: No clubbing, cyanosis or edema.  DP +1 Neuro: Alert and oriented X 3. Psych:  Good affect, responds appropriately  TELEMETRY: Reviewed telemetry pt in ST  ASSESSMENT AND PLAN:  Active Problems:  She is stable and events noted in HPI. Will give 20mg  of IV lasix now with transfusion and positive I/O's. 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 CYTOLOGY PENDING. INR subtherapuetic but with recent surgery and severe anemia will hold off on  systemic anticoagulation.    Valera Castle, MD 09/07/2011 7:51 AM

## 2011-09-07 NOTE — Progress Notes (Signed)
eLink Physician-Brief Progress Note Patient Name: Kristen Keller DOB: 27-Nov-1950 MRN: [id number]  Date of Service  09/07/2011   HPI/Events of Note  Hgb down to 6.9 from 7.8   eICU Interventions  Transfuse 1 unit of PRBCs over 2 hours.   Intervention Category Intermediate Interventions:  (blood loss anemia)  [name] 09/07/2011, 5:01 AM

## 2011-09-07 NOTE — Progress Notes (Signed)
ANTICOAGULATION CONSULT NOTE - Initial Consult  Pharmacy Consult for Lovenox Indication: DVT, PE, afib; INR subtherapeutic  Allergies  Allergen Reactions  . Codeine     REACTION: "feels like walking outside her body", itching  . Erythromycin     REACTION: hives, sob, diff. breathing  . Zolpidem Tartrate     REACTION: keeps her awake, shaking    Patient Measurements: Height: 4\' 11"  (149.9 cm) Weight: 108 lb 3.9 oz (49.1 kg) IBW/kg (Calculated) : 43.2  Adjusted Body Weight: n/a  Vital Signs: Temp: 99.3 F (37.4 C) (12/08 0800) Temp src: Oral (12/08 0800) BP: 124/47 mmHg (12/08 0728) Pulse Rate: 104  (12/08 0800)  Labs:  Basename 09/07/11 0409 09/06/11 1250  HGB 6.9* 7.8*  HCT 22.4* 25.9*  PLT 359 424*  APTT -- 45*  LABPROT 19.8* 25.1*  INR 1.65* 2.23*  HEPARINUNFRC -- --  CREATININE 0.42* 0.43*  CKTOTAL -- --  CKMB -- --  TROPONINI -- --   Estimated Creatinine Clearance: 51.6 ml/min (by C-G formula based on Cr of 0.42).  Medical History: Past Medical History  Diagnosis Date  . COPD (chronic obstructive pulmonary disease)   . Hypertension   . Diabetes mellitus   . Atrial fibrillation 2008  . Pulmonary embolism 2012  . DVT (deep venous thrombosis) unk  . Atrial fibrillation 2008  . Pneumonia   . Pulmonary fibrosis     Medications:  Scheduled:    . ALPRAZolam  1 mg Oral TID  . antiseptic oral rinse  15 mL Mouth Rinse QID  . cefUROXime (ZINACEF)  IV  1.5 g Intravenous Once  . chlorhexidine  15 mL Mouth Rinse BID  . digoxin  0.25 mg Intravenous Daily  . Fluticasone-Salmeterol  1 puff Inhalation BID  . furosemide  20 mg Intravenous Once  . levofloxacin (LEVAQUIN) IV  500 mg Intravenous Q24H  . loratadine  10 mg Oral Daily  . metoCLOPramide  5 mg Oral TID AC & HS  . montelukast  10 mg Oral QHS  . pantoprazole  40 mg Oral Daily  . sodium chloride  3 mL Intravenous Q12H  . spironolactone  25 mg Oral Daily  . tiotropium  18 mcg Inhalation Daily  .  venlafaxine  75 mg Oral Q breakfast  . DISCONTD: cefTRIAXone (ROCEPHIN)  IV  1 g Intravenous Q24H  . DISCONTD: cefUROXime  1.5 g Intravenous Q8H  . DISCONTD: digoxin  250 mcg Oral Daily  . DISCONTD: diltiazem  120 mg Oral Daily  . DISCONTD: Fluticasone-Salmeterol  1 puff Inhalation BID  . DISCONTD: furosemide  20 mg Oral BID  . DISCONTD: metoCLOPramide  5 mg Oral QID  . DISCONTD: montelukast  10 mg Oral QHS  . DISCONTD: pantoprazole  40 mg Oral Q1200  . DISCONTD: tiotropium  18 mcg Inhalation Daily  . DISCONTD: venlafaxine  37.5 mg Oral Q breakfast  . DISCONTD: warfarin  2 mg Oral Custom  . DISCONTD: warfarin  2-4 mg Oral Daily  . DISCONTD: warfarin  4 mg Oral Custom    Assessment: 60 yo female with history of DVT/PE/afib on chronic Coumadin which was held, INR reversed prior to pericardial window.  INR now subtherapeutic (MD dosing Coumadin).  Spoke with Dr. Daleen Squibb, he would like pharmacy to add Lovenox at DVT prophylactic dosing while INR < 2 since pt with decreased Hgb, recent OR.  No overt bleeding noted per chart notes.  Goal of Therapy:  N/A   Plan:  Lovenox 40 mg sq q24 hrs.  Will d/c lovenox when INR >2.  Reece Leader C 09/07/2011,8:27 AM

## 2011-09-07 NOTE — Progress Notes (Signed)
Kristen Keller is a 60 y.o. female admitted on 09/06/2011 with dyspnea, pericardial effusion, lung mass.  Has hx of COPD on home oxygen.  Lines / Drains:  PIV  Aline 12/07>> ETT 12/07>>  Antibiotics:  Levaquin 12/07>>  Tests / Events:  Pericardiocentesis 12/6  Pericardial window 12/7   SUBJECTIVE:   OBJECTIVE:  Blood pressure 170/65, pulse 112, temperature 99.7 F (37.6 C), temperature source Oral, resp. rate 18, height 4\' 11"  (1.499 m), weight 108 lb 3.9 oz (49.1 kg), SpO2 91.00%.   Intake/Output Summary (Last 24 hours) at 09/07/11 1153 Last data filed at 09/07/11 1100  Gross per 24 hour  Intake 3955.25 ml  Output   2044 ml  Net 1911.25 ml     Lab Results  Component Value Date   CREATININE 0.42* 09/07/2011   BUN 10 09/07/2011   NA 135 09/07/2011   K 3.8 09/07/2011   CL 93* 09/07/2011   CO2 33* 09/07/2011   Lab Results  Component Value Date   WBC 17.4* 09/07/2011   HGB 6.9* 09/07/2011   HCT 22.4* 09/07/2011   MCV 88.5 09/07/2011   PLT 359 09/07/2011     Dg Chest Port 1 View  09/07/2011  *RADIOLOGY REPORT*  Clinical Data: Window.  PORTABLE CHEST - 1 VIEW  Comparison: None.  Findings: Endotracheal tube is present with the tip 31 mm from the carina.  Right paratracheal soft tissue density mass is present, possibly related to the right hilum.  This measures 51 mm x 38 mm. Appearance is suspicious for neoplasm.  Follow-up chest CT should be considered if not already performed.  Pericardial drain is present.  Cardiopericardial silhouette appears within normal limits for projection.  There is no pneumothorax identified.  IMPRESSION:  1.  Endotracheal tube 31 mm from the carina. 2.  Pericardial drain is present with normal size of the cardiopericardial silhouette. 3.  Right suprahilar / paratracheal mass suspicious for neoplasm. Follow-up chest CT, preferably with infusion, should be considered if this is a new finding.  Original Report Authenticated By: Andreas Newport, M.D.     ASSESSMENT/PLAN:  Pericardial effusion s/p window 12/07 -chest tube, post-op care per TCTS -f/u pericardial fluid for cytology  Lung mass -likely malignant -await results of pericardial fluid and then decide if additional biopsy attempts are needed  VDRF after pericardial window -will proceed with extubation  COPD with ?hx of pulmonary fibrosis -no significant evidence of pulmonary fibrosis on chest imaging studies -continue inhalers  ?acute bronchitis -continue levaquin  Chronic hypoxic respiratory failure -titrate oxygen to keep SpO2 > 92%  A fib, HTN -per cardiology  Hx of DVT, PE -okay to resume anticoagulation per TCTS>>defer to primary team  DM -per primary team  Anemia -f/u CBC -transfuse for Hb < 7  Leukocytosis -f/u CBC  Goals of care -limited resuscitation>>no CPR, defibrillation  Updated family at bedside about plan.  Kristen Keller Pager:  [phone] 09/07/2011, 11:53 AM  Critical care time 35 minutes

## 2011-09-07 NOTE — Progress Notes (Signed)
CRITICAL VALUE ALERT  Critical value received:  Hemoglobin 6.9  Date of notification:  09-07-11  Time of notification:  0437  Critical value read back:yes  Nurse who received alert:  Montine Circle  MD notified (1st page): e-link MD  Time of first page: [phone] MD notified (2nd page):  Time of second page:  Responding MD: Dr. Darrick Penna Time MD responded: [phone]

## 2011-09-07 NOTE — Progress Notes (Signed)
Pt extubated to a 2L [place] due to MD order. Pt is stable. RT will continue to monitor

## 2011-09-07 NOTE — Op Note (Signed)
NAMESUMER, MOOREHOUSE NO.:  [id number]  MEDICAL RECORD NO.:  [id number]  LOCATION:  2901                         FACILITY:  [facility]  PHYSICIAN:  Salvatore Decent. Dorris Fetch, M.D.DATE OF BIRTH:  [birth year]  DATE OF PROCEDURE:  09/06/2011 DATE OF DISCHARGE:                              OPERATIVE REPORT   PREOPERATIVE DIAGNOSIS:  Pericardial effusion.  POSTOPERATIVE DIAGNOSIS:  Pericardial effusion.  PROCEDURE:  Subxiphoid pericardial window.  SURGEON:  Salvatore Decent. Dorris Fetch, M.D.  ASSISTANT:  Al Corpus.  ANESTHESIA:  General.  FINDINGS:  500 mL of bloody pericardial fluid.  Pericardium thickened, fibrinous exudate on the epicardial surface.  CLINICAL NOTE:  Kristen Keller is a 60 year old woman recently diagnosed with DVT and anticoagulated, she presented with progressive shortness of breath, workup revealed a right upper lobe mass, extensive mediastinal adenopathy and a large pericardial effusion.  She was noted to be in cardiac tamponade and had pericardiocentesis by Dr. Lewayne Bunting at [facility], which stabilized the patient hemodynamically.  She was transferred to [facility] for consideration of a subxiphoid pericardial window.  On arrival, the patient was hemodynamically stable. She was requiring a non-rebreather mask, but was not in acute respiratory distress.  The indications, risks, benefits, and alternatives were discussed in detail with the patient and her daughters.  She understood the risks, accepted them and agreed to proceed with a subxiphoid pericardial window.  OPERATIVE NOTE:  Mrs. Pigman was brought to the preoperative holding area on September 06, 2011, there the Anesthesia Service placed an arterial blood pressure monitoring line and established adequate intravenous access.  Intravenous antibiotics were administered.  She was taken to the operating room, anesthetized, and intubated.  The chest and abdomen were prepped and draped in usual  sterile fashion.  A midline incision was made over the xiphoid process extending over the upper portion of the abdomen, was carried through the skin and subcutaneous tissue. Hemostasis was achieved with electrocautery.  The xiphoid was cartilaginous, it was excised, retraction then was placed on the underside of the sternum and the underlying fatty tissues were divided exposing the pericardium.  The pericardium was incised and dark bloody fluid was evacuated.  This was nonclotting.  Specimens were Sent for culture as well as cytology.  A 2 x 2-cm portion of the pericardium was excised and sent for permanent pathology.  A sucker tip was gently passed around in the pericardium to make sure that all areas where drained.  There were no significant areas of un-drained fluid at the completion of the procedure, a total of 500 mL of fluid was evacuated.  A 28-French Blake drain was placed through a separate stab incision and secured with a #1 silk suture, it was wrecked into the pericardium anteriorly and posteriorly.  The wound then was closed with a running #1 Vicryl fascial suture followed by 2-0 Vicryl subcutaneous suture and a 3-0 Vicryl subcuticular suture.  All sponge, needle, and instrument counts were correct at the end of procedure.  The patient was taken from the operating room to the postanesthetic care unit in good condition.     Salvatore Decent Dorris Fetch, M.D.     SCH/MEDQ  D:  09/06/2011  T:  09/07/2011  Job:  [id number]

## 2011-09-07 NOTE — Progress Notes (Signed)
1 Day Post-Op Procedure(s) (LRB): PERICARDIAL WINDOW (N/A) Subjective: Intubated Responsive, denies pain  Objective: Vital signs in last 24 hours: Temp:  [97.3 F (36.3 C)-100.5 F (38.1 C)] 100.2 F (37.9 C) (12/08 0900) Pulse Rate:  [54-111] 102  (12/08 0900) Cardiac Rhythm:  [-] Sinus tachycardia (12/08 0800) Resp:  [11-32] 14  (12/08 0900) BP: (86-150)/(41-71) 120/49 mmHg (12/08 0900) SpO2:  [82 %-100 %] 95 % (12/08 0800) Arterial Line BP: (99-175)/(40-56) 126/49 mmHg (12/08 0800) FiO2 (%):  [30 %-40 %] 30 % (12/08 0800) Weight:  [47.1 kg (103 lb 13.4 oz)-49.1 kg (108 lb 3.9 oz)] 108 lb 3.9 oz (49.1 kg) (12/08 0645)  Hemodynamic parameters for last 24 hours:    Intake/Output from previous day: 12/07 0701 - 12/08 0700 In: 3630.3 [P.O.:20; I.V.:2928.8; Blood:681.5] Out: 1438 [Urine:1300; Blood:50; Chest Tube:88] Intake/Output this shift: Total I/O In: 325 [I.V.:75; Blood:250] Out: 106 [Urine:100; Chest Tube:6]  General appearance: alert Heart: regular rate and rhythm Lungs: egophony apex - right Wound: dressing with minimal sanguinous spotting  Lab Results:  Basename 09/07/11 0409 09/06/11 1250  WBC 17.4* 19.8*  HGB 6.9* 7.8*  HCT 22.4* 25.9*  PLT 359 424*   BMET:  Basename 09/07/11 0409 09/06/11 1250  NA 135 136  K 3.8 4.0  CL 93* 92*  CO2 33* 34*  GLUCOSE 107* 98  BUN 10 14  CREATININE 0.42* 0.43*  CALCIUM 8.4 8.9    PT/INR:  Basename 09/07/11 0409  LABPROT 19.8*  INR 1.65*   ABG No results found for this basename: phart, pco2, po2, hco3, tco2, acidbasedef, o2sat   CBG (last 3)   Basename 09/07/11 0405 09/06/11 2333 09/06/11 2046  GLUCAP 99 113* 100*    Assessment/Plan: S/P Procedure(s) (LRB): PERICARDIAL WINDOW (N/A) CT to water seal- will leave in today even though drainage is minimal Vent per CCM OK to resume anticoagulation from my standpoint   LOS: 1 day    Kristen Keller C 09/07/2011

## 2011-09-08 ENCOUNTER — Inpatient Hospital Stay (HOSPITAL_COMMUNITY): Payer: Medicare Other

## 2011-09-08 DIAGNOSIS — J449 Chronic obstructive pulmonary disease, unspecified: Secondary | ICD-10-CM

## 2011-09-08 DIAGNOSIS — Z9911 Dependence on respirator [ventilator] status: Secondary | ICD-10-CM

## 2011-09-08 DIAGNOSIS — R222 Localized swelling, mass and lump, trunk: Secondary | ICD-10-CM

## 2011-09-08 DIAGNOSIS — J9600 Acute respiratory failure, unspecified whether with hypoxia or hypercapnia: Secondary | ICD-10-CM

## 2011-09-08 LAB — GLUCOSE, CAPILLARY
Glucose-Capillary: 87 mg/dL (ref 70–99)
Glucose-Capillary: 127 mg/dL — ABNORMAL HIGH (ref 70–99)

## 2011-09-08 LAB — CBC
MCH: 26.8 pg (ref 26.0–34.0)
MCHC: 30.2 g/dL (ref 30.0–36.0)
MCV: 88.7 fL (ref 78.0–100.0)
Platelets: 364 10*3/uL (ref 150–400)
RBC: 3.1 MIL/uL — ABNORMAL LOW (ref 3.87–5.11)
RDW: 16.3 % — ABNORMAL HIGH (ref 11.5–15.5)

## 2011-09-08 LAB — BASIC METABOLIC PANEL
BUN: 8 mg/dL (ref 6–23)
CO2: 37 mEq/L — ABNORMAL HIGH (ref 19–32)
Calcium: 8.6 mg/dL (ref 8.4–10.5)
Creatinine, Ser: 0.39 mg/dL — ABNORMAL LOW (ref 0.50–1.10)
GFR calc non Af Amer: >90 mL/min (ref >90)
Glucose, Bld: 83 mg/dL (ref 70–99)

## 2011-09-08 LAB — TYPE AND SCREEN
Antibody Screen: NEGATIVE
Unit division: 0

## 2011-09-08 MED ORDER — BIOTENE DRY MOUTH MT LIQD
15.0000 mL | Freq: Two times a day (BID) | OROMUCOSAL | Status: DC
  Administered 2011-09-08 – 2011-09-19 (×21): 15 mL via OROMUCOSAL
  Filled 2011-09-08: qty 15

## 2011-09-08 MED ORDER — POTASSIUM CHLORIDE 20 MEQ/15ML (10%) PO LIQD
20.0000 meq | Freq: Every day | ORAL | Status: DC
  Administered 2011-09-08 – 2011-09-19 (×12): 20 meq via ORAL
  Filled 2011-09-08 (×13): qty 15

## 2011-09-08 MED ORDER — DIGOXIN 250 MCG PO TABS
0.2500 mg | ORAL_TABLET | Freq: Every day | ORAL | Status: DC
  Administered 2011-09-09 – 2011-09-19 (×11): 0.25 mg via ORAL
  Filled 2011-09-08 (×12): qty 1

## 2011-09-08 MED ORDER — LEVOFLOXACIN 500 MG PO TABS
500.0000 mg | ORAL_TABLET | Freq: Every day | ORAL | Status: DC
  Administered 2011-09-08 – 2011-09-19 (×12): 500 mg via ORAL
  Filled 2011-09-08 (×12): qty 1

## 2011-09-08 NOTE — Progress Notes (Signed)
Patient ID: Kristen Keller, female   DOB: [birth year], 60 y.o.   MRN: [id number] SUBJECTIVE: Extubated and comfortable. No CP or SOB. Spoke to daughter by phone and gave update.  Filed Vitals:   09/08/11 0500 09/08/11 0600 09/08/11 0700 09/08/11 0722  BP: 115/48 118/68 111/45 111/45  Pulse: 102 102 103 104  Temp:      TempSrc:      Resp: 30 18 19 20   Height:      Weight:      SpO2: 96% 96% 95% 95%    Intake/Output Summary (Last 24 hours) at 09/08/11 0755 Last data filed at 09/08/11 0600  Gross per 24 hour  Intake    665 ml  Output   1271 ml  Net   -606 ml    LABS: Basic Metabolic Panel:  Basename 09/08/11 0510 09/07/11 0409 09/06/11 1250  NA 138 135 --  K 3.4* 3.8 --  CL 94* 93* --  CO2 37* 33* --  GLUCOSE 83 107* --  BUN 8 10 --  CREATININE 0.39* 0.42* --  CALCIUM 8.6 8.4 --  MG -- -- 2.0  PHOS -- -- --   Liver Function Tests:  Basename 09/06/11 1250  AST 752*  ALT 1482*  ALKPHOS 75  BILITOT 0.4  PROT 6.9  ALBUMIN 2.8*   No results found for this basename: LIPASE:2,AMYLASE:2 in the last 72 hours CBC:  Basename 09/08/11 0510 09/07/11 1600 09/06/11 1250  WBC 19.0* 22.3* --  NEUTROABS -- -- 17.2*  HGB 8.3* 8.2* --  HCT 27.5* 27.1* --  MCV 88.7 87.1 --  PLT 364 337 --   Cardiac Enzymes: No results found for this basename: CKTOTAL:3,CKMB:3,CKMBINDEX:3,TROPONINI:3 in the last 72 hours BNP: No results found for this basename: POCBNP:3 in the last 72 hours D-Dimer: No results found for this basename: DDIMER:2 in the last 72 hours Hemoglobin A1C: No results found for this basename: HGBA1C in the last 72 hours Fasting Lipid Panel:  Basename 09/07/11 0409  CHOL 159  HDL 27*  LDLCALC 100*  TRIG 162*  CHOLHDL 5.9  LDLDIRECT --   Thyroid Function Tests:  Basename 09/06/11 1250  TSH 1.345  T4TOTAL --  T3FREE --  THYROIDAB --   Anemia Panel: No results found for this basename: VITAMINB12,FOLATE,FERRITIN,TIBC,IRON,RETICCTPCT in the last 72  hours  RADIOLOGY: Dg Chest Port 1 View  09/07/2011  *RADIOLOGY REPORT*  Clinical Data: Pericardial effusion.  Status post pericardial window. Lung carcinoma.  PORTABLE CHEST - 1 VIEW  Comparison: 09/07/2011  Findings: Pericardial drain remains in place and heart size remains within normal limits.  Endotracheal tube remains in appropriate position.  Probable tiny left pleural effusion and mild left basilar atelectasis again noted.  5 cm mass in the medial right upper lobe is stable.  IMPRESSION:  1.  Normal heart size with pericardial drain in place. 2.  Stable probable tiny left pleural effusion and left basilar atelectasis.  Three stable right upper lobe mass.  Original Report Authenticated By: Danae Orleans, M.D.   Dg Chest Port 1 View  09/07/2011  *RADIOLOGY REPORT*  Clinical Data: Window.  PORTABLE CHEST - 1 VIEW  Comparison: None.  Findings: Endotracheal tube is present with the tip 31 mm from the carina.  Right paratracheal soft tissue density mass is present, possibly related to the right hilum.  This measures 51 mm x 38 mm. Appearance is suspicious for neoplasm.  Follow-up chest CT should be considered if not already performed.  Pericardial drain is  present.  Cardiopericardial silhouette appears within normal limits for projection.  There is no pneumothorax identified.  IMPRESSION:  1.  Endotracheal tube 31 mm from the carina. 2.  Pericardial drain is present with normal size of the cardiopericardial silhouette. 3.  Right suprahilar / paratracheal mass suspicious for neoplasm. Follow-up chest CT, preferably with infusion, should be considered if this is a new finding.  Original Report Authenticated By: Andreas Newport, M.D.    PHYSICAL EXAM General: Well developed, well nourished, in no acute distress Head: Eyes PERRLA, No xanthomas.   Normal cephalic and atramatic  Lungs: decreased BS throughout, no wheezes Heart: HRRR S1 S2, no rub.  Pulses are 2+ & equal.            No carotid bruit. No JVD.   No abdominal bruits. No femoral bruits. Abdomen: Bowel sounds are positive, abdomen soft and non-tender without masses or                  Hernia's noted. Msk:  Back normal, normal gait. Normal strength and tone for age. Extremities: No clubbing, cyanosis or edema.  DP +1 Neuro: Alert and oriented X 3. Psych:  Good affect, responds appropriately  TELEMETRY: Reviewed telemetry pt in ST  ASSESSMENT AND PLAN  Malignant Pericarditis, S/P Tamponade, Pericardial drain still in. Cytology pending.  Hypokalemia. Will replace. Patient aware of preliminary diagnosis.   Valera Castle, MD 09/08/2011 7:55 AM

## 2011-09-08 NOTE — Progress Notes (Signed)
Patient ID: SHEQUILA NEGLIA, female   DOB: 1951-06-03, 60 y.o.   MRN: [id number] Extubated Comfortable BP 105/54  Pulse 105  Temp(Src) 98.8 F (37.1 C) (Oral)  Resp 21  Ht 4\' 11"  (1.499 m)  Wt 49.4 kg (108 lb 14.5 oz)  BMI 22.00 kg/m2  SpO2 96% CT only 46 cc out yesterday, minimal overnight D/C CT Await path- hopefully will be back tomorrow

## 2011-09-08 NOTE — Progress Notes (Signed)
Kristen Keller is a 60 y.o. female admitted on 09/06/2011 with dyspnea, pericardial effusion, lung mass.  Has hx of COPD on home oxygen.  Lines / Drains:  PIV  Aline 12/07>>12/9 ETT 12/07>>12/8  Antibiotics:  Levaquin 12/07>>  Tests / Events:  Pericardiocentesis 12/6  Pericardial window 12/7   SUBJECTIVE:   "I am feeling better today-sore when I cough but otherwise better"   OBJECTIVE:  Blood pressure 105/54, pulse 105, temperature 98.8 F (37.1 C), temperature source Oral, resp. rate 21, height 4\' 11"  (1.499 m), weight 108 lb 14.5 oz (49.4 kg), SpO2 96.00%.   Intake/Output Summary (Last 24 hours) at 09/08/11 1113 Last data filed at 09/08/11 0800  Gross per 24 hour  Intake    460 ml  Output    665 ml  Net   -205 ml   General - Alert, no distress HEENT - no oral exudate Cardiac - s1s2  Chest - scattered rhonchi Abd - soft, nontender Ext - no edema Neuro - normal strength   Lab Results  Component Value Date   CREATININE 0.39* 09/08/2011   BUN 8 09/08/2011   NA 138 09/08/2011   K 3.4* 09/08/2011   CL 94* 09/08/2011   CO2 37* 09/08/2011   Lab Results  Component Value Date   WBC 19.0* 09/08/2011   HGB 8.3* 09/08/2011   HCT 27.5* 09/08/2011   MCV 88.7 09/08/2011   PLT 364 09/08/2011     Dg Chest Port 1 View  09/08/2011  *RADIOLOGY REPORT*  Clinical Data: Productive cough.  Fever.  Pericardial effusion. Lung mass.  PORTABLE CHEST - 1 VIEW  Comparison: 09/07/2011  Findings: Endotracheal tube has been removed.  Pericardial drain remains in place and heart size remains within normal limits.  Increased bibasilar pulmonary opacity is seen likely representing combination of pleural effusion and atelectasis.  Mass in the medial right upper lobe is stable.  IMPRESSION:  1.  Increased bibasilar opacity, likely due to combination of pleural effusions and atelectasis. 2.  Stable mass in the medial right upper lobe.  Original Report Authenticated By: Danae Orleans, M.D.   Dg Chest Port  1 View  09/07/2011  *RADIOLOGY REPORT*  Clinical Data: Pericardial effusion.  Status post pericardial window. Lung carcinoma.  PORTABLE CHEST - 1 VIEW  Comparison: 09/07/2011  Findings: Pericardial drain remains in place and heart size remains within normal limits.  Endotracheal tube remains in appropriate position.  Probable tiny left pleural effusion and mild left basilar atelectasis again noted.  5 cm mass in the medial right upper lobe is stable.  IMPRESSION:  1.  Normal heart size with pericardial drain in place. 2.  Stable probable tiny left pleural effusion and left basilar atelectasis.  Three stable right upper lobe mass.  Original Report Authenticated By: Danae Orleans, M.D.   Dg Chest Port 1 View  09/07/2011  *RADIOLOGY REPORT*  Clinical Data: Window.  PORTABLE CHEST - 1 VIEW  Comparison: None.  Findings: Endotracheal tube is present with the tip 31 mm from the carina.  Right paratracheal soft tissue density mass is present, possibly related to the right hilum.  This measures 51 mm x 38 mm. Appearance is suspicious for neoplasm.  Follow-up chest CT should be considered if not already performed.  Pericardial drain is present.  Cardiopericardial silhouette appears within normal limits for projection.  There is no pneumothorax identified.  IMPRESSION:  1.  Endotracheal tube 31 mm from the carina. 2.  Pericardial drain is present with  normal size of the cardiopericardial silhouette. 3.  Right suprahilar / paratracheal mass suspicious for neoplasm. Follow-up chest CT, preferably with infusion, should be considered if this is a new finding.  Original Report Authenticated By: Andreas Newport, M.D.    ASSESSMENT/PLAN:  Pericardial effusion s/p window 12/07 -chest tube, post-op care per TCTS -f/u pericardial fluid for cytology  Lung mass -likely malignant -await results of pericardial fluid and then decide if additional biopsy attempts are needed  VDRF after pericardial window -extubated w/o  difficulty 12/08  COPD with ? hx of pulmonary fibrosis -no significant evidence of pulmonary fibrosis on chest imaging studies -continue inhalers  ?acute bronchitis -continue levaquin>>can change to po  Chronic hypoxic respiratory failure -titrate oxygen to keep SpO2 > 92%  A fib, HTN -per cardiology  Hx of DVT, PE -okay to resume anticoagulation per TCTS>>defer to primary team  DM -per primary team  Anemia -f/u CBC -transfuse for Hb < 7  Leukocytosis -f/u CBC -abx as above  Goals of care -limited resuscitation>>no CPR, defibrillation    Canary Brim, NP-C [facility] Pgr: [phone]  Nehal Shives 09/08/2011, 11:15 AM Pager:  [phone]

## 2011-09-09 ENCOUNTER — Inpatient Hospital Stay (HOSPITAL_COMMUNITY): Payer: Medicare Other

## 2011-09-09 ENCOUNTER — Encounter (HOSPITAL_COMMUNITY): Payer: Self-pay | Admitting: Thoracic Surgery (Cardiothoracic Vascular Surgery)

## 2011-09-09 DIAGNOSIS — I10 Essential (primary) hypertension: Secondary | ICD-10-CM | POA: Insufficient documentation

## 2011-09-09 DIAGNOSIS — I319 Disease of pericardium, unspecified: Secondary | ICD-10-CM

## 2011-09-09 DIAGNOSIS — I82409 Acute embolism and thrombosis of unspecified deep veins of unspecified lower extremity: Secondary | ICD-10-CM | POA: Insufficient documentation

## 2011-09-09 DIAGNOSIS — J910 Malignant pleural effusion: Secondary | ICD-10-CM | POA: Diagnosis present

## 2011-09-09 DIAGNOSIS — J189 Pneumonia, unspecified organism: Secondary | ICD-10-CM | POA: Insufficient documentation

## 2011-09-09 DIAGNOSIS — J8410 Pulmonary fibrosis, unspecified: Secondary | ICD-10-CM | POA: Insufficient documentation

## 2011-09-09 DIAGNOSIS — I2699 Other pulmonary embolism without acute cor pulmonale: Secondary | ICD-10-CM | POA: Insufficient documentation

## 2011-09-09 LAB — CBC
HCT: 30.2 % — ABNORMAL LOW (ref 36.0–46.0)
Hemoglobin: 9.1 g/dL — ABNORMAL LOW (ref 12.0–15.0)
RDW: 15.9 % — ABNORMAL HIGH (ref 11.5–15.5)
WBC: 17.1 10*3/uL — ABNORMAL HIGH (ref 4.0–10.5)

## 2011-09-09 LAB — GLUCOSE, CAPILLARY
Glucose-Capillary: 80 mg/dL (ref 70–99)
Glucose-Capillary: 165 mg/dL — ABNORMAL HIGH (ref 70–99)

## 2011-09-09 LAB — PROTIME-INR
INR: 1.17 (ref 0.00–1.49)
Prothrombin Time: 15.1 seconds (ref 11.6–15.2)

## 2011-09-09 LAB — HEPARIN LEVEL (UNFRACTIONATED)
Heparin Unfractionated: <0.1 IU/mL — ABNORMAL LOW (ref 0.30–0.70)
Heparin Unfractionated: <0.1 IU/mL — ABNORMAL LOW (ref 0.30–0.70)

## 2011-09-09 LAB — BASIC METABOLIC PANEL
Chloride: 94 mEq/L — ABNORMAL LOW (ref 96–112)
GFR calc Af Amer: >90 mL/min (ref >90)
GFR calc non Af Amer: >90 mL/min (ref >90)
Potassium: 3.8 mEq/L (ref 3.5–5.1)

## 2011-09-09 MED ORDER — HEPARIN BOLUS VIA INFUSION
1500.0000 [IU] | Freq: Once | INTRAVENOUS | Status: AC
  Administered 2011-09-09: 1500 [IU] via INTRAVENOUS
  Filled 2011-09-09: qty 1500

## 2011-09-09 MED ORDER — BOOST / RESOURCE BREEZE PO LIQD
1.0000 | Freq: Two times a day (BID) | ORAL | Status: DC
  Administered 2011-09-09 – 2011-09-17 (×10): 1 via ORAL

## 2011-09-09 MED ORDER — HEPARIN BOLUS VIA INFUSION
1400.0000 [IU] | Freq: Once | INTRAVENOUS | Status: AC
  Administered 2011-09-09: 1400 [IU] via INTRAVENOUS
  Filled 2011-09-09: qty 1400

## 2011-09-09 MED ORDER — SODIUM CHLORIDE 0.9 % IV SOLN
INTRAVENOUS | Status: DC
  Administered 2011-09-09 – 2011-09-10 (×2): via INTRAVENOUS
  Administered 2011-09-11: 10 mL/h via INTRAVENOUS
  Administered 2011-09-12: 20:00:00 via INTRAVENOUS
  Administered 2011-09-12: 50 mL/h via INTRAVENOUS

## 2011-09-09 MED ORDER — HEPARIN SOD (PORCINE) IN D5W 100 UNIT/ML IV SOLN
600.0000 [IU]/h | INTRAVENOUS | Status: DC
  Administered 2011-09-09: 600 [IU]/h via INTRAVENOUS
  Filled 2011-09-09: qty 250

## 2011-09-09 MED ORDER — HEPARIN SOD (PORCINE) IN D5W 100 UNIT/ML IV SOLN
1000.0000 [IU]/h | INTRAVENOUS | Status: DC
  Administered 2011-09-10 (×2): 1000 [IU]/h via INTRAVENOUS
  Filled 2011-09-09 (×2): qty 250

## 2011-09-09 MED ORDER — ALPRAZOLAM 0.5 MG PO TABS
0.5000 mg | ORAL_TABLET | Freq: Three times a day (TID) | ORAL | Status: DC
  Administered 2011-09-09 – 2011-09-11 (×8): 0.5 mg via ORAL
  Filled 2011-09-09 (×8): qty 1

## 2011-09-09 NOTE — Progress Notes (Signed)
Patient Name: Kristen Keller Date of Encounter: 09/09/2011  Patient Problem List  Diagnoses  . HYPERTENSION, UNSPECIFIED  . ATRIAL FIBRILLATION  . COPD  . TOBACCO ABUSE, HX OF  . Encounter for long-term (current) use of anticoagulants  . COPD (chronic obstructive pulmonary disease)  . Hypertension  . Pulmonary embolism  . DVT (deep venous thrombosis)  . Pneumonia  . Pulmonary fibrosis  . Pericardial effusion, acute  . Pleural effusion, malignant    SUBJECTIVE: Pt has cough, prod of yellow/white sputum. Some CP with cough at times. Thinks she is breathing better. Weak. Not eating much.   OBJECTIVE Filed Vitals:   09/09/11 0300 09/09/11 0400 09/09/11 0500 09/09/11 0600  BP: 106/48 104/55  130/61  Pulse: 103 102  110  Temp:  98.7 F (37.1 C)    TempSrc:  Oral    Resp: 18 18  23   Height:      Weight:   109 lb 5.6 oz (49.6 kg)   SpO2: 98% 97%  95%  Incentive Spir 375cc       Intake/Output Summary (Last 24 hours) at 09/09/11 0656 Last data filed at 09/09/11 0500  Gross per 24 hour  Intake    240 ml  Output    400 ml  Net   -160 ml   Wt Readings from Last 3 Encounters:  09/09/11 109 lb 5.6 oz (49.6 kg)  09/09/11 109 lb 5.6 oz (49.6 kg)  07/03/10 97 lb (43.999 kg)   Weight change: 7.1 oz (0.2 kg)  PHYSICAL EXAM Neck: Supple without bruits, JVD approx 8cm. Lungs:  Resp regular, rales all over and rhonchi in the bases. Heart: RRR no s3, s4, or murmurs. Abdomen: Soft, non-tender, non-distended, BS + x 4.  Extremities: No clubbing, cyanosis or edema. DP/PT/Radials 2+ and equal bilaterally. Neuro: Alert and oriented X 3. Moves all extremities spontaneously. Psych: Normal affect.  LABS:  CBC: Basename 09/09/11 0525 09/08/11 0510 09/06/11 1250  WBC 17.1* 19.0* --  NEUTROABS -- -- 17.2*  HGB 9.1* 8.3* --  HCT 30.2* 27.5* --  MCV 88.8 88.7 --  PLT 327 364 --   Basic Metabolic Panel: Basename 09/09/11 0525 09/08/11 0510 09/06/11 1250  NA 139 138 --  K 3.8  3.4* --  CL 94* 94* --  CO2 38* 37* --  GLUCOSE 98 83 --  BUN 5* 8 --  CREATININE 0.38* 0.39* --  CALCIUM 8.9 8.6 --  MG -- -- 2.0  PHOS -- -- --   Liver Function Tests: Basename 09/06/11 1250  AST 752*  ALT 1482*  ALKPHOS 75  BILITOT 0.4  PROT 6.9  ALBUMIN 2.8*   Fasting Lipid Panel: Basename 09/07/11 0409  CHOL 159  HDL 27*  LDLCALC 100*  TRIG 162*  CHOLHDL 5.9  LDLDIRECT --   Thyroid Function Tests:  Basename 09/06/11 1250  TSH 1.345  T4TOTAL --  T3FREE --  THYROIDAB --   TELE - ST  Radiology/Studies:  12/10 CXR results pending  09/08/2011  *RADIOLOGY REPORT*  Clinical Data: Productive cough.  Fever.  Pericardial effusion. Lung mass.  PORTABLE CHEST - 1 VIEW  Comparison: 09/07/2011  Findings: Endotracheal tube has been removed.  Pericardial drain remains in place and heart size remains within normal limits.  Increased bibasilar pulmonary opacity is seen likely representing combination of pleural effusion and atelectasis.  Mass in the medial right upper lobe is stable.   IMPRESSION:  1.  Increased bibasilar opacity, likely due to combination of pleural  effusions and atelectasis. 2.  Stable mass in the medial right upper lobe.  Original Report Authenticated By: Danae Orleans, M.D.   Current Medications:    . ALPRAZolam  1 mg Oral TID  . antiseptic oral rinse  15 mL Mouth Rinse BID  . digoxin  0.25 mg Oral Daily  . enoxaparin (LOVENOX) injection  40 mg Subcutaneous Q24H  . Fluticasone-Salmeterol  1 puff Inhalation BID  . levofloxacin  500 mg Oral Daily  . loratadine  10 mg Oral Daily  . metoCLOPramide  5 mg Oral TID AC & HS  . montelukast  10 mg Oral QHS  . pantoprazole  40 mg Oral Daily  . potassium chloride  20 mEq Oral Daily  . sodium chloride  3 mL Intravenous Q12H  . spironolactone  25 mg Oral Daily  . tiotropium  18 mcg Inhalation Daily  . venlafaxine  75 mg Oral Q breakfast  . DISCONTD: digoxin  0.25 mg Intravenous Daily  . DISCONTD: levofloxacin  (LEVAQUIN) IV  500 mg Intravenous Q24H    ASSESSMENT AND PLAN: Active problems: Pericardial effusion, acute - s/p window - pathology pending  Pleural effusion, malignant - s/p chest tube - pathology pending, possible mets. ?Oncology consult?   VDRF: after peric. window, extubated 12-8, mgt per CCM  COPD on home O2: mgt per CCM  Anticoag: INR therapeutic at [facility], given Vit K, FFP and now on DVT Lovenox only. (see below)  Weakness - long Hx poor PO intake - add supp., PT/OT to see once plan more defined. Also may be over-sedated because she seems to get overly sleepy after her (home dose) Xanax 1mg  TID (scheduled).   Signed, Theodore Demark , PA-C  I have personally seen and examined this patient. I agree with the assessment and plan as outlined above. She has a history of recent DVT and remote h/o PE. OK with TCTS to resume anti-coagulation. Will resume full dose heparin drip. Will need to restart coumadin this week after we see if any biopsy of lung mass with be necessary. Transfer to step down ICU.

## 2011-09-09 NOTE — Progress Notes (Addendum)
3 Days Post-Op Procedure(s) (LRB): PERICARDIAL WINDOW (N/A)  Subjective: Apparently had some confusion over night. Only complaint is cough.  Objective: Vital signs in last 24 hours: Patient Vitals for the past 24 hrs:  BP Temp Temp src Pulse Resp SpO2 Weight  09/09/11 0839 - - - - - 93 % -  09/09/11 0800 109/67 mmHg - - 109  25  96 % -  09/09/11 0700 133/42 mmHg - - 110  25  94 % -  09/09/11 0600 130/61 mmHg - - 110  23  95 % -    Current Weight  09/09/11 109 lb 5.6 oz (49.6 kg)       Intake/Output from previous day: 12/09 0701 - 12/10 0700 In: 240 [P.O.:240] Out: 400 [Urine:400]   Physical Exam:  Cardiovascular: Slightly tachycardic, no murmurs, gallops, or rubs. Pulmonary: Some rhonchi; no rales, wheezes, or rhonchi. Abdomen: Soft, non tender, bowel sounds present. Extremities: No real bilateral lower extremity edema. Wound: Clean and dry.  No erythema or signs of infection.  Lab Results: CBC: Basename 09/09/11 0525 09/08/11 0510  WBC 17.1* 19.0*  HGB 9.1* 8.3*  HCT 30.2* 27.5*  PLT 327 364   BMET:  Basename 09/09/11 0525 09/08/11 0510  NA 139 138  K 3.8 3.4*  CL 94* 94*  CO2 38* 37*  GLUCOSE 98 83  BUN 5* 8  CREATININE 0.38* 0.39*  CALCIUM 8.9 8.6    PT/INR:  Basename 09/09/11 0525  LABPROT 15.1  INR 1.17   ABG:  INR: Will add last result for INR, ABG once components are confirmed Will add last 4 CBG results once components are confirmed  Assessment/Plan:  Surgically stable s/p subxiphoid pericardial window 12/7. Await pathology (has history of lung mass). Chest tube removed 12/9. CXR today shows small bilateral pl effusions with airspace disease (L>R) and suprahilar mass (as seen previously).Management per critical care et al.  Ardelle Balls, PA 09/09/2011   Tissue negative, await cytology on fluid

## 2011-09-09 NOTE — Progress Notes (Signed)
ANTICOAGULATION CONSULT NOTE - Follow Up Consult  Pharmacy Consult for Heparin-Full dose Indication: history of DVT (11/12)/PE and Afib  Allergies  Allergen Reactions  . Codeine     REACTION: "feels like walking outside her body", itching  . Erythromycin     REACTION: hives, sob, diff. breathing  . Zolpidem Tartrate     REACTION: keeps her awake, shaking    Patient Measurements: Height: 4\' 11"  (149.9 cm) Weight: 109 lb 5.6 oz (49.6 kg) IBW/kg (Calculated) : 43.2  Heparin dosing weight: 49.6 kg  Vital Signs: Temp: 98.7 F (37.1 C) (12/10 0400) Temp src: Oral (12/10 0400) BP: 130/61 mmHg (12/10 0600) Pulse Rate: 110  (12/10 0600)  Labs:  Basename 09/09/11 0525 09/08/11 0510 09/07/11 1600 09/07/11 0409 09/06/11 1250  HGB 9.1* 8.3* -- -- --  HCT 30.2* 27.5* 27.1* -- --  PLT 327 364 337 -- --  APTT -- -- -- -- 45*  LABPROT 15.1 15.4* -- 19.8* --  INR 1.17 1.19 -- 1.65* --  HEPARINUNFRC -- -- -- -- --  CREATININE 0.38* 0.39* -- 0.42* --  CKTOTAL -- -- -- -- --  CKMB -- -- -- -- --  TROPONINI -- -- -- -- --   Estimated Creatinine Clearance: 51.6 ml/min (by C-G formula based on Cr of 0.38).   Medications:  Scheduled:    . ALPRAZolam  0.5 mg Oral TID  . antiseptic oral rinse  15 mL Mouth Rinse BID  . digoxin  0.25 mg Oral Daily  . feeding supplement  1 Container Oral BID  . Fluticasone-Salmeterol  1 puff Inhalation BID  . levofloxacin  500 mg Oral Daily  . loratadine  10 mg Oral Daily  . metoCLOPramide  5 mg Oral TID AC & HS  . montelukast  10 mg Oral QHS  . pantoprazole  40 mg Oral Daily  . potassium chloride  20 mEq Oral Daily  . sodium chloride  3 mL Intravenous Q12H  . spironolactone  25 mg Oral Daily  . tiotropium  18 mcg Inhalation Daily  . venlafaxine  75 mg Oral Q breakfast  . DISCONTD: ALPRAZolam  1 mg Oral TID  . DISCONTD: digoxin  0.25 mg Intravenous Daily  . DISCONTD: enoxaparin (LOVENOX) injection  40 mg Subcutaneous Q24H  . DISCONTD:  levofloxacin (LEVAQUIN) IV  500 mg Intravenous Q24H    Assessment: 60 year old female with Afib and DVT (11/12)/history of PE on chronic coumadin prior to admission--on hold 2/2 to pericardial window and chest tube placement now to resume anticoagulation with full dose heparin. H/H has improved and platelets have been stable. INR 1.17, EstCrCl~31ml/min. Last lovenox 40mg  dose was 12/6 at 10:40am. Lovenox has been discontinued. Appears chest tube has been removed 12/9 per RN.   Goal of Therapy:  Heparin level 0.3-0.7 units/ml   Plan:  1. Heparin low bolus of 1400 units x1, then start drip at 600 units/hr (33ml/hr). 2. Will check a heparin level 6 hours after rate initiated. 3. Will check daily heparin levels and CBC.   Fayne Norrie 09/09/2011,8:17 AM

## 2011-09-09 NOTE — Progress Notes (Signed)
Kristen Keller is a 60 y.o. female admitted on 09/06/2011 with dyspnea, pericardial effusion, lung mass.  Has hx of COPD on home oxygen.  Lines / Drains:  PIV  Aline 12/07>>12/9 ETT 12/07>>12/8  Antibiotics:  Levaquin 12/07>>  Tests / Events:  Pericardiocentesis 12/6  Pericardial window 12/7   SUBJECTIVE:   Confused overnight.  Denies chest pain, dyspnea.  Still has cough.   OBJECTIVE:  Blood pressure 109/67, pulse 109, temperature 98.7 F (37.1 C), temperature source Oral, resp. rate 25, height 4\' 11"  (1.499 m), weight 109 lb 5.6 oz (49.6 kg), SpO2 93.00%.   Intake/Output Summary (Last 24 hours) at 09/09/11 1134 Last data filed at 09/09/11 0900  Gross per 24 hour  Intake    120 ml  Output    650 ml  Net   -530 ml   General - Alert, no distress HEENT - no oral exudate Cardiac - s1s2  Chest - scattered rhonchi, basilar rales Abd - soft, nontender Ext - no edema Neuro - normal strength   Lab Results  Component Value Date   CREATININE 0.38* 09/09/2011   BUN 5* 09/09/2011   NA 139 09/09/2011   K 3.8 09/09/2011   CL 94* 09/09/2011   CO2 38* 09/09/2011   Lab Results  Component Value Date   WBC 17.1* 09/09/2011   HGB 9.1* 09/09/2011   HCT 30.2* 09/09/2011   MCV 88.8 09/09/2011   PLT 327 09/09/2011     Dg Chest Port 1 View  09/09/2011  *RADIOLOGY REPORT*  Clinical Data: Shortness of breath  PORTABLE CHEST - 1 VIEW  Comparison: 09/08/2011  Findings: Left greater than right bilateral lower lobe airspace disease noted.  Small bilateral effusions persist.  A few Kerley B lines are noted.  Right suprahilar mass is stable.  Mild enlargement of the cardiomediastinal silhouette noted without overt edema.  IMPRESSION: Stable left greater than right bilateral lower lobe airspace opacities which could represent asymmetric edema in the setting of mild interstitial edema or pneumonia.  Right suprahilar mass again noted, for which further evaluation with chest CT with IV  contrast was previously recommended if not already performed at an outside institution.  Original Report Authenticated By: Harrel Lemon, M.D.   Dg Chest Port 1 View  09/08/2011  *RADIOLOGY REPORT*  Clinical Data: Productive cough.  Fever.  Pericardial effusion. Lung mass.  PORTABLE CHEST - 1 VIEW  Comparison: 09/07/2011  Findings: Endotracheal tube has been removed.  Pericardial drain remains in place and heart size remains within normal limits.  Increased bibasilar pulmonary opacity is seen likely representing combination of pleural effusion and atelectasis.  Mass in the medial right upper lobe is stable.  IMPRESSION:  1.  Increased bibasilar opacity, likely due to combination of pleural effusions and atelectasis. 2.  Stable mass in the medial right upper lobe.  Original Report Authenticated By: Danae Orleans, M.D.   Dg Chest Port 1 View  09/07/2011  *RADIOLOGY REPORT*  Clinical Data: Pericardial effusion.  Status post pericardial window. Lung carcinoma.  PORTABLE CHEST - 1 VIEW  Comparison: 09/07/2011  Findings: Pericardial drain remains in place and heart size remains within normal limits.  Endotracheal tube remains in appropriate position.  Probable tiny left pleural effusion and mild left basilar atelectasis again noted.  5 cm mass in the medial right upper lobe is stable.  IMPRESSION:  1.  Normal heart size with pericardial drain in place. 2.  Stable probable tiny left pleural effusion and left basilar atelectasis.  Three stable right upper lobe mass.  Original Report Authenticated By: Danae Orleans, M.D.    ASSESSMENT/PLAN:  Pericardial effusion s/p window 12/07 -post-op care per TCTS -f/u pericardial fluid for cytology  Lung mass -likely malignant -await results of pericardial fluid and then decide if additional biopsy attempts are needed  VDRF after pericardial window -extubated w/o difficulty 12/08  COPD -continue inhalers  ?acute bronchitis -continue levaquin for total of 7  days  Chronic hypoxic respiratory failure -titrate oxygen to keep SpO2 > 92%  A fib, HTN -per cardiology  Hx of DVT, PE -heparin gtt per primary team  DM -per primary team  Anemia -f/u CBC -transfuse for Hb < 7  Leukocytosis -f/u CBC -abx as above  Delirium -primary team adjusting meds  Goals of care -limited resuscitation>>no CPR, defibrillation  She is on stable pulmonary regimen.  PCCM will sign off.  Please call back if assistance is needed with additional tissue sampling in the event that pericardial fluid analysis is unrevealing.  Siriyah Ambrosius 09/09/2011, 11:34 AM Pager:  [phone]

## 2011-09-09 NOTE — Progress Notes (Signed)
ANTICOAGULATION CONSULT NOTE - Follow Up Consult  Pharmacy Consult for IV heparin  Indication: atrial fibrillation, history of DVT/PE  Allergies  Allergen Reactions  . Codeine     REACTION: "feels like walking outside her body", itching  . Erythromycin     REACTION: hives, sob, diff. breathing  . Zolpidem Tartrate     REACTION: keeps her awake, shaking    Patient Measurements: Height: 4\' 11"  (149.9 cm) Weight: 109 lb 5.6 oz (49.6 kg) IBW/kg (Calculated) : 43.2  Heparin dosing weight: 49.6 kg  Vital Signs: Temp: 99.3 F (37.4 C) (12/10 1100) Temp src: Oral (12/10 1100) BP: 109/57 mmHg (12/10 1500) Pulse Rate: 108  (12/10 1500)  Labs:  Basename 09/09/11 1523 09/09/11 0525 09/08/11 0510 09/07/11 1600 09/07/11 0409  HGB -- 9.1* 8.3* -- --  HCT -- 30.2* 27.5* 27.1* --  PLT -- 327 364 337 --  APTT -- -- -- -- --  LABPROT -- 15.1 15.4* -- 19.8*  INR -- 1.17 1.19 -- 1.65*  HEPARINUNFRC <0.10* -- -- -- --  CREATININE -- 0.38* 0.39* -- 0.42*  CKTOTAL -- -- -- -- --  CKMB -- -- -- -- --  TROPONINI -- -- -- -- --   Estimated Creatinine Clearance: 51.6 ml/min (by C-G formula based on Cr of 0.38).   Medications:  Infusions:    . heparin 600 Units/hr (09/09/11 1400)    Assessment: 60 year old female s/p pericardial window, chest tube removed, started on  IV heparin today for Afib and history of DVT/PE. CBC low but stable. No bleeding noted.   Goal of Therapy:  Heparin level 0.3-0.7 units/ml   Plan:  1. Heparin bolus of 1500 units x1. 2. Increase heparin drip to 800 units/hr.  3. Follow-up 6 hour level after change.   Fayne Norrie 09/09/2011,4:25 PM

## 2011-09-10 ENCOUNTER — Encounter (HOSPITAL_COMMUNITY): Payer: Self-pay | Admitting: Cardiology

## 2011-09-10 DIAGNOSIS — I959 Hypotension, unspecified: Secondary | ICD-10-CM

## 2011-09-10 DIAGNOSIS — R9430 Abnormal result of cardiovascular function study, unspecified: Secondary | ICD-10-CM | POA: Insufficient documentation

## 2011-09-10 DIAGNOSIS — D649 Anemia, unspecified: Secondary | ICD-10-CM

## 2011-09-10 LAB — GLUCOSE, CAPILLARY
Glucose-Capillary: 95 mg/dL (ref 70–99)
Glucose-Capillary: 148 mg/dL — ABNORMAL HIGH (ref 70–99)
Glucose-Capillary: 111 mg/dL — ABNORMAL HIGH (ref 70–99)

## 2011-09-10 LAB — CBC
HCT: 28.4 % — ABNORMAL LOW (ref 36.0–46.0)
MCHC: 29.6 g/dL — ABNORMAL LOW (ref 30.0–36.0)
MCV: 89.9 fL (ref 78.0–100.0)
Platelets: 269 10*3/uL (ref 150–400)
RDW: 15.6 % — ABNORMAL HIGH (ref 11.5–15.5)
WBC: 14.3 10*3/uL — ABNORMAL HIGH (ref 4.0–10.5)

## 2011-09-10 LAB — HEPARIN LEVEL (UNFRACTIONATED): Heparin Unfractionated: 0.24 IU/mL — ABNORMAL LOW (ref 0.30–0.70)

## 2011-09-10 LAB — BASIC METABOLIC PANEL
BUN: 3 mg/dL — ABNORMAL LOW (ref 6–23)
CO2: 41 mEq/L (ref 19–32)
Calcium: 8.8 mg/dL (ref 8.4–10.5)
Creatinine, Ser: 0.35 mg/dL — ABNORMAL LOW (ref 0.50–1.10)

## 2011-09-10 MED ORDER — HEPARIN BOLUS VIA INFUSION
2000.0000 [IU] | Freq: Once | INTRAVENOUS | Status: AC
  Administered 2011-09-10: 2000 [IU] via INTRAVENOUS
  Filled 2011-09-10: qty 2000

## 2011-09-10 MED ORDER — HEPARIN SOD (PORCINE) IN D5W 100 UNIT/ML IV SOLN
1400.0000 [IU]/h | INTRAVENOUS | Status: DC
  Administered 2011-09-10: 1400 [IU]/h via INTRAVENOUS
  Administered 2011-09-10: 1200 [IU]/h via INTRAVENOUS
  Administered 2011-09-11 – 2011-09-13 (×3): 1400 [IU]/h via INTRAVENOUS
  Filled 2011-09-10 (×7): qty 250

## 2011-09-10 MED ORDER — HEPARIN BOLUS VIA INFUSION
1500.0000 [IU] | Freq: Once | INTRAVENOUS | Status: AC
  Administered 2011-09-10: 1500 [IU] via INTRAVENOUS
  Filled 2011-09-10: qty 1500

## 2011-09-10 MED ORDER — SODIUM CHLORIDE 0.9 % IV SOLN
INTRAVENOUS | Status: DC
  Administered 2011-09-10: 75 mL via INTRAVENOUS

## 2011-09-10 NOTE — Progress Notes (Signed)
ANTICOAGULATION CONSULT NOTE - Follow Up Consult  Pharmacy Consult for IV heparin  Indication: atrial fibrillation, history of DVT/PE  Allergies  Allergen Reactions  . Codeine     REACTION: "feels like walking outside her body", itching  . Erythromycin     REACTION: hives, sob, diff. breathing  . Zolpidem Tartrate     REACTION: keeps her awake, shaking    Patient Measurements: Height: 4\' 11"  (149.9 cm) Weight: 109 lb 5.6 oz (49.6 kg) IBW/kg (Calculated) : 43.2  Heparin dosing weight: 49.6 kg  Vital Signs: Temp: 98.7 F (37.1 C) (12/11 0000) Temp src: Oral (12/11 0000) BP: 119/60 mmHg (12/11 0100) Pulse Rate: 109  (12/11 0100)  Labs:  Basename 09/09/11 2245 09/09/11 1523 09/09/11 0525 09/08/11 0510 09/07/11 1600 09/07/11 0409  HGB -- -- 9.1* 8.3* -- --  HCT -- -- 30.2* 27.5* 27.1* --  PLT -- -- 327 364 337 --  APTT -- -- -- -- -- --  LABPROT -- -- 15.1 15.4* -- 19.8*  INR -- -- 1.17 1.19 -- 1.65*  HEPARINUNFRC <0.10* <0.10* -- -- -- --  CREATININE -- -- 0.38* 0.39* -- 0.42*  CKTOTAL -- -- -- -- -- --  CKMB -- -- -- -- -- --  TROPONINI -- -- -- -- -- --   Estimated Creatinine Clearance: 51.6 ml/min (by C-G formula based on Cr of 0.38).   Medications:  Infusions:     . sodium chloride 10 mL/hr at 09/10/11 0000  . heparin 800 Units/hr (09/10/11 0000)  . DISCONTD: heparin 600 Units/hr (09/09/11 1400)    Assessment: 60yo female remains undetectable on heparin despite boluses and rate increases.  Goal of Therapy:  Heparin level 0.3-0.7 units/ml   Plan:  Will give bolus of 2000 units and increase gtt by 4 units/kg/hr to 1000 units/hr and check level in 6hr.  Colleen Can PharmD BCPS 09/10/2011,1:11 AM

## 2011-09-10 NOTE — Progress Notes (Signed)
Patient ID: Kristen Keller, female   DOB: 1951/08/31, 60 y.o.   MRN: [id number]   Feels better today. Rested well last night. Has not ambulated except to go to bathroom. BP 94/51  Pulse 93  Temp(Src) 97.9 F (36.6 C) (Oral)  Resp 18  Ht 4\' 11"  (1.499 m)  Wt 48.6 kg (107 lb 2.3 oz)  BMI 21.64 kg/m2  SpO2 99% Wound clean and dry. Path negative for malignancy (inflammation only) Cytology still pending, I'm not sure why it's taking so long.  Ambulate.

## 2011-09-10 NOTE — Progress Notes (Addendum)
Patient ID: Kristen Keller, female   DOB: November 07, 1950, 60 y.o.   MRN: [id number] SUBJECTIVE:Patient is stable today. I talked with to her her family members in the room. She is lying in bed comfortably. Her blood pressure is on the low side. She is not receiving any diuretics.   Filed Vitals:   09/10/11 0400 09/10/11 0452 09/10/11 0500 09/10/11 0828  BP: 104/56  97/54   Pulse: 100  96   Temp: 98.2 F (36.8 C)   98.5 F (36.9 C)  TempSrc: Oral   Oral  Resp: 18  18   Height:      Weight:  107 lb 2.3 oz (48.6 kg)    SpO2: 99%  100% 99%    Intake/Output Summary (Last 24 hours) at 09/10/11 0955 Last data filed at 09/10/11 0700  Gross per 24 hour  Intake 646.17 ml  Output   1275 ml  Net -628.83 ml    LABS: Basic Metabolic Panel:  Basename 09/10/11 0535 09/09/11 0525  NA 143 139  K 3.7 3.8  CL 95* 94*  CO2 41* 38*  GLUCOSE 97 98  BUN 3* 5*  CREATININE 0.35* 0.38*  CALCIUM 8.8 8.9  MG -- --  PHOS -- --   Liver Function Tests: No results found for this basename: AST:2,ALT:2,ALKPHOS:2,BILITOT:2,PROT:2,ALBUMIN:2 in the last 72 hours No results found for this basename: LIPASE:2,AMYLASE:2 in the last 72 hours CBC:  Basename 09/10/11 0535 09/09/11 0525  WBC 14.3* 17.1*  NEUTROABS -- --  HGB 8.4* 9.1*  HCT 28.4* 30.2*  MCV 89.9 88.8  PLT 269 327   Cardiac Enzymes: No results found for this basename: CKTOTAL:3,CKMB:3,CKMBINDEX:3,TROPONINI:3 in the last 72 hours BNP: No components found with this basename: POCBNP:3 D-Dimer: No results found for this basename: DDIMER:2 in the last 72 hours Hemoglobin A1C: No results found for this basename: HGBA1C in the last 72 hours Fasting Lipid Panel: No results found for this basename: CHOL,HDL,LDLCALC,TRIG,CHOLHDL,LDLDIRECT in the last 72 hours Thyroid Function Tests: No results found for this basename: TSH,T4TOTAL,FREET3,T3FREE,THYROIDAB in the last 72 hours  RADIOLOGY: Dg Chest Port 1 View  09/09/2011  *RADIOLOGY REPORT*   Clinical Data: Shortness of breath  PORTABLE CHEST - 1 VIEW  Comparison: 09/08/2011  Findings: Left greater than right bilateral lower lobe airspace disease noted.  Small bilateral effusions persist.  A few Kerley B lines are noted.  Right suprahilar mass is stable.  Mild enlargement of the cardiomediastinal silhouette noted without overt edema.  IMPRESSION: Stable left greater than right bilateral lower lobe airspace opacities which could represent asymmetric edema in the setting of mild interstitial edema or pneumonia.  Right suprahilar mass again noted, for which further evaluation with chest CT with IV contrast was previously recommended if not already performed at an outside institution.  Original Report Authenticated By: Harrel Lemon, M.D.   Dg Chest Port 1 View  09/08/2011  *RADIOLOGY REPORT*  Clinical Data: Productive cough.  Fever.  Pericardial effusion. Lung mass.  PORTABLE CHEST - 1 VIEW  Comparison: 09/07/2011  Findings: Endotracheal tube has been removed.  Pericardial drain remains in place and heart size remains within normal limits.  Increased bibasilar pulmonary opacity is seen likely representing combination of pleural effusion and atelectasis.  Mass in the medial right upper lobe is stable.  IMPRESSION:  1.  Increased bibasilar opacity, likely due to combination of pleural effusions and atelectasis. 2.  Stable mass in the medial right upper lobe.  Original Report Authenticated By: Danae Orleans, M.D.  Dg Chest Port 1 View  09/07/2011  *RADIOLOGY REPORT*  Clinical Data: Pericardial effusion.  Status post pericardial window. Lung carcinoma.  PORTABLE CHEST - 1 VIEW  Comparison: 09/07/2011  Findings: Pericardial drain remains in place and heart size remains within normal limits.  Endotracheal tube remains in appropriate position.  Probable tiny left pleural effusion and mild left basilar atelectasis again noted.  5 cm mass in the medial right upper lobe is stable.  IMPRESSION:  1.  Normal  heart size with pericardial drain in place. 2.  Stable probable tiny left pleural effusion and left basilar atelectasis.  Three stable right upper lobe mass.  Original Report Authenticated By: Danae Orleans, M.D.   Dg Chest Port 1 View  09/07/2011  *RADIOLOGY REPORT*  Clinical Data: Window.  PORTABLE CHEST - 1 VIEW  Comparison: None.  Findings: Endotracheal tube is present with the tip 31 mm from the carina.  Right paratracheal soft tissue density mass is present, possibly related to the right hilum.  This measures 51 mm x 38 mm. Appearance is suspicious for neoplasm.  Follow-up chest CT should be considered if not already performed.  Pericardial drain is present.  Cardiopericardial silhouette appears within normal limits for projection.  There is no pneumothorax identified.  IMPRESSION:  1.  Endotracheal tube 31 mm from the carina. 2.  Pericardial drain is present with normal size of the cardiopericardial silhouette. 3.  Right suprahilar / paratracheal mass suspicious for neoplasm. Follow-up chest CT, preferably with infusion, should be considered if this is a new finding.  Original Report Authenticated By: Andreas Newport, M.D.    PHYSICAL EXAM  Patient is fatigued but stable. She is oriented to person time and place. Affect is normal. Lungs reveal scattered rhonchi. Cardiac exam reveals S1 and S2. There is a soft systolic murmur. The abdomen is soft. There's no significant peripheral edema.   TELEMETRY:   I have reviewed telemetry. There is normal sinus rhythm.   ASSESSMENT AND PLAN:  Principal Problem:   *Pleural effusion, malignant    The patient has had a surgical drainage of her pericardial effusion. We are awaiting pathology to determine the next steps concerning this and her lung mass.  Active Problems:   Encounter for long-term (current) use of anticoagulants     Careful attention is being patent to her anticoagulation with prior history of deep venous thrombosis.   DVT (deep  venous thrombosis)    There is history of deep venous thrombosis in the past and there is careful attention being made to her anticoagulation   Pericardial effusion, acute  Anemia   Hypotension   Blood pressure is relatively low. She is on spironolactone. I have stopped his today.. . I will turn her IV rate up to 75 cc an hour for today.   Ejection fraction    We know from her echo done recently at [facility] but her ejection fraction is 55% - 60%    Willa Rough 09/10/2011 9:55 AM

## 2011-09-10 NOTE — Progress Notes (Signed)
ANTICOAGULATION CONSULT NOTE - Follow Up Consult  Pharmacy Consult for Heparin Indication:  Atrial fibrillation. Hx DVT/PE  Allergies  Allergen Reactions  . Codeine     REACTION: "feels like walking outside her body", itching  . Erythromycin     REACTION: hives, sob, diff. breathing  . Zolpidem Tartrate     REACTION: keeps her awake, shaking    Patient Measurements: Height: 4\' 11"  (149.9 cm) Weight: 107 lb 2.3 oz (48.6 kg) IBW/kg (Calculated) : 43.2   Vital Signs: Temp: 98.3 F (36.8 C) (12/11 1703) Temp src: Oral (12/11 1703) BP: 94/51 mmHg (12/11 1131) Pulse Rate: 104  (12/11 1600)  Labs:  Basename 09/10/11 1743 09/10/11 0845 09/10/11 0535 09/09/11 2245 09/09/11 0525 09/08/11 0510  HGB -- -- 8.4* -- 9.1* --  HCT -- -- 28.4* -- 30.2* 27.5*  PLT -- -- 269 -- 327 364  APTT -- -- -- -- -- --  LABPROT -- -- -- -- 15.1 15.4*  INR -- -- -- -- 1.17 1.19  HEPARINUNFRC 0.24* 0.13* -- <0.10* -- --  CREATININE -- -- 0.35* -- 0.38* 0.39*  CKTOTAL -- -- -- -- -- --  CKMB -- -- -- -- -- --  TROPONINI -- -- -- -- -- --   Estimated Creatinine Clearance: 51.6 ml/min (by C-G formula based on Cr of 0.35).   Assessment:    Heparin level 0.24 tonight on 1200 units/hr.    Still subtherapeutic, but approaching target range.    POD #4 pericardial window.  Goal of Therapy:     Heparin level 0.3-0.7   Plan:    Will increase Heparin drip to 1400 units/hr.   Next level in 6 hrs.  Continue daily CBC.  Dennie Fetters Pager: [phone] 09/10/2011,6:43 PM

## 2011-09-10 NOTE — Progress Notes (Signed)
ANTICOAGULATION CONSULT NOTE - Follow Up Consult  Pharmacy Consult for Heparin  Indication: atrial fibrillation, hx DVT/PE  Assessment: 60 yo F s/p pericardial window, on IV heparin for Afib and hx/o DVT/PE.  Heparin level remains subtherapeutic at 0.13.  CBC low but stable.  No bleeding per progress notes.  Per RN, patient lost IV access for an unknown period of time overnight.  Goal of Therapy: Heparin level 0.3-0.7 units/ml   Plan:  1. Heparin IV bolus 1500 units x1 2. Increase IV heparin infusion rate to 1200 units/hr (12 mL/hr) 3. Check 6 hour HL  Ike Bene M 09/10/2011,10:10 AM  Allergies  Allergen Reactions  . Codeine     REACTION: "feels like walking outside her body", itching  . Erythromycin     REACTION: hives, sob, diff. breathing  . Zolpidem Tartrate     REACTION: keeps her awake, shaking    Patient Measurements: Height: 4\' 11"  (149.9 cm) Weight: 107 lb 2.3 oz (48.6 kg) IBW/kg (Calculated) : 43.2  Heparin Dosing Weight: 48.6kg  Vital Signs: Temp: 98.5 F (36.9 C) (12/11 0828) Temp src: Oral (12/11 0828) BP: 97/54 mmHg (12/11 0500) Pulse Rate: 96  (12/11 0500)  Labs:  Basename 09/10/11 0845 09/10/11 0535 09/09/11 2245 09/09/11 1523 09/09/11 0525 09/08/11 0510  HGB -- 8.4* -- -- 9.1* --  HCT -- 28.4* -- -- 30.2* 27.5*  PLT -- 269 -- -- 327 364  APTT -- -- -- -- -- --  LABPROT -- -- -- -- 15.1 15.4*  INR -- -- -- -- 1.17 1.19  HEPARINUNFRC 0.13* -- <0.10* <0.10* -- --  CREATININE -- 0.35* -- -- 0.38* 0.39*  CKTOTAL -- -- -- -- -- --  CKMB -- -- -- -- -- --  TROPONINI -- -- -- -- -- --   Estimated Creatinine Clearance: 51.6 ml/min (by C-G formula based on Cr of 0.35).   Medications:  Scheduled:    . ALPRAZolam  0.5 mg Oral TID  . antiseptic oral rinse  15 mL Mouth Rinse BID  . digoxin  0.25 mg Oral Daily  . feeding supplement  1 Container Oral BID  . Fluticasone-Salmeterol  1 puff Inhalation BID  . heparin  1,400 Units Intravenous Once    . heparin  1,500 Units Intravenous Once  . heparin  2,000 Units Intravenous Once  . levofloxacin  500 mg Oral Daily  . loratadine  10 mg Oral Daily  . metoCLOPramide  5 mg Oral TID AC & HS  . montelukast  10 mg Oral QHS  . pantoprazole  40 mg Oral Daily  . potassium chloride  20 mEq Oral Daily  . sodium chloride  3 mL Intravenous Q12H  . tiotropium  18 mcg Inhalation Daily  . venlafaxine  75 mg Oral Q breakfast  . DISCONTD: spironolactone  25 mg Oral Daily

## 2011-09-11 ENCOUNTER — Inpatient Hospital Stay (HOSPITAL_COMMUNITY): Payer: Medicare Other

## 2011-09-11 ENCOUNTER — Encounter (HOSPITAL_COMMUNITY): Payer: Self-pay | Admitting: Nurse Practitioner

## 2011-09-11 DIAGNOSIS — R918 Other nonspecific abnormal finding of lung field: Secondary | ICD-10-CM | POA: Insufficient documentation

## 2011-09-11 DIAGNOSIS — I309 Acute pericarditis, unspecified: Principal | ICD-10-CM

## 2011-09-11 DIAGNOSIS — I4891 Unspecified atrial fibrillation: Secondary | ICD-10-CM

## 2011-09-11 LAB — CBC
MCH: 26.5 pg (ref 26.0–34.0)
MCHC: 29.2 g/dL — ABNORMAL LOW (ref 30.0–36.0)
Platelets: 271 10*3/uL (ref 150–400)

## 2011-09-11 LAB — HEPARIN LEVEL (UNFRACTIONATED): Heparin Unfractionated: 0.34 IU/mL (ref 0.30–0.70)

## 2011-09-11 LAB — GLUCOSE, CAPILLARY: Glucose-Capillary: 153 mg/dL — ABNORMAL HIGH (ref 70–99)

## 2011-09-11 NOTE — Progress Notes (Signed)
ANTICOAGULATION CONSULT NOTE - Follow Up Consult  Pharmacy Consult for Heparin Indication:  Atrial fibrillation. Hx DVT/PE  Allergies  Allergen Reactions  . Codeine     REACTION: "feels like walking outside her body", itching  . Erythromycin     REACTION: hives, sob, diff. breathing  . Zolpidem Tartrate     REACTION: keeps her awake, shaking    Patient Measurements: Height: 4\' 11"  (149.9 cm) Weight: 109 lb 5.6 oz (49.6 kg) IBW/kg (Calculated) : 43.2   Vital Signs: Temp: 98.4 F (36.9 C) (12/12 1250) Temp src: Oral (12/12 1250) BP: 154/86 mmHg (12/12 1242) Pulse Rate: 110  (12/12 1250)  Labs:  Basename 09/11/11 1202 09/11/11 0336 09/11/11 0308 09/10/11 1743 09/10/11 0535 09/09/11 0525  HGB -- -- 8.6* -- 8.4* --  HCT -- -- 29.5* -- 28.4* 30.2*  PLT -- -- 271 -- 269 327  APTT -- -- -- -- -- --  LABPROT -- -- -- -- -- 15.1  INR -- -- -- -- -- 1.17  HEPARINUNFRC <0.10* 0.33 -- 0.24* -- --  CREATININE -- -- -- -- 0.35* 0.38*  CKTOTAL -- -- -- -- -- --  CKMB -- -- -- -- -- --  TROPONINI -- -- -- -- -- --   Estimated Creatinine Clearance: 51.6 ml/min (by C-G formula based on Cr of 0.35).   Assessment: 60yo female on IV heparin for Afib and history of DVT/PE as well as s/p pericardial window 5 days ago. Heparin level was therapeutic overnight and is now undetectable. RN states that there have been no problems with the lines and heparin infusing well.    Goal of Therapy:     Heparin level 0.3-0.7   Plan:  Will repeat heparin level to confirm.   Fayne Norrie PharmD BCPS 09/11/2011,1:26 PM

## 2011-09-11 NOTE — Progress Notes (Addendum)
Patient Name: Kristen Keller Date of Encounter: 09/11/2011     Principal Problem:  *Pericardial effusion, acute Active Problems:  DVT (deep venous thrombosis)  Lung mass  Pulmonary embolism  Pleural effusion, malignant  Anemia  Hypotension    SUBJECTIVE:  No chest pain.  Reports that breathing has been a little more compromised.  Productive cough.   CURRENT MEDS    . ALPRAZolam  0.5 mg Oral TID  . antiseptic oral rinse  15 mL Mouth Rinse BID  . digoxin  0.25 mg Oral Daily  . feeding supplement  1 Container Oral BID  . Fluticasone-Salmeterol  1 puff Inhalation BID  . heparin  1,500 Units Intravenous Once  . levofloxacin  500 mg Oral Daily  . loratadine  10 mg Oral Daily  . metoCLOPramide  5 mg Oral TID AC & HS  . montelukast  10 mg Oral QHS  . pantoprazole  40 mg Oral Daily  . potassium chloride  20 mEq Oral Daily  . sodium chloride  3 mL Intravenous Q12H  . tiotropium  18 mcg Inhalation Daily  . venlafaxine  75 mg Oral Q breakfast  . DISCONTD: spironolactone  25 mg Oral Daily    OBJECTIVE  Filed Vitals:   09/11/11 0400 09/11/11 0434 09/11/11 0746 09/11/11 0800  BP:   113/56   Pulse:   103 100  Temp: 98.6 F (37 C)  98.6 F (37 C)   TempSrc: Oral  Oral   Resp:   21 15  Height:      Weight:  109 lb 5.6 oz (49.6 kg)    SpO2:   94% 95%    Intake/Output Summary (Last 24 hours) at 09/11/11 1610 Last data filed at 09/11/11 0800  Gross per 24 hour  Intake 1972.9 ml  Output   1550 ml  Net  422.9 ml   Weight change: 2 lb 3.3 oz (1 kg)  PHYSICAL EXAM  General: Well developed, well nourished, in no acute distress. Head: Normocephalic, atraumatic, sclera non-icteric, no xanthomas, nares are without discharge.  Neck: Supple without bruits or JVD. Lungs:  Resp regular and unlabored.  Intermittent wet cough.  Right basilar crackles with diminished breath sound bilaterally - worst in left base. Heart: RRR - distant. no s3, s4, rubs, or murmurs. Abdomen:  Soft, non-tender, non-distended, BS + x 4.  Msk:  Strength and tone appears normal for age. Extremities: No clubbing, cyanosis or edema. DP/PT/Radials 2+ and equal bilaterally. Neuro: Alert and oriented X 3. Moves all extremities spontaneously. Psych: Normal affect.  LABS:  CBC:  Basename 09/11/11 0308 09/10/11 0535  WBC 13.8* 14.3*  NEUTROABS -- --  HGB 8.6* 8.4*  HCT 29.5* 28.4*  MCV 91.0 89.9  PLT 271 269   Basic Metabolic Panel:  Basename 09/10/11 0535 09/09/11 0525  NA 143 139  K 3.7 3.8  CL 95* 94*  CO2 41* 38*  GLUCOSE 97 98  BUN 3* 5*  CREATININE 0.35* 0.38*  CALCIUM 8.8 8.9  MG -- --  PHOS -- --   TELE  RSR  ASSESSMENT AND PLAN:  1.  Pericardial Effusion:  S/p window.  Pathology suggests inflammation w/o evidence of malignancy.  Cytology pending.  Appreciate TCTS guidance.  2.  Right Lung Mass:  Await cytology from above.  Further mgmt per TCTS.  3.  DVT/PE:  Cont heparin per pharmacy.  Awaiting cytology and further plans prior to resuming coumadin.  4.  Hypotension:  Improved.  Cleda Daub d/c'd yesterday.  5.  Confusion:  Clearing.  6.  ? PNA:  On Levaquin.  Afebrile.    Signed, Nicolasa Ducking NP   I have seen and examined the patient. I agree with the above note with the addition of : This is a 60 year old female with multiple recent admissions for pneumonia, DVT and right lung mass suspicious for cancer. She presented with large pericardial effusion and had initial pericardiocentesis followed by pericardial window. The cytology is positive for adenocarcinoma. The patient is overall very weak and deconditioned. She still complaining of significant dyspnea with any minimal activities even going to the bathroom. I watched her come back from the bathroom and she had severe dyspnea associated with bradycardia and hypoxia. Her vital signs improved after she was placed on bedrest. I discussed the findings with the patient's daughter but I avoided having  this discussion with the patient herself given that she wasn't feeling too well. I will order a chest x-ray and also repeat a limited echocardiogram given her continued symptoms of dyspnea. I will also discontinue her IV fluids. I will consult oncology for their input. The patient has what seems to be at least stage III lung cancer with very poor functional capacity. I doubt that she would be a candidate for surgery to remove the mass.  Lorine Bears 09/11/2011 4:40 PM

## 2011-09-11 NOTE — Progress Notes (Signed)
ANTICOAGULATION CONSULT NOTE - Follow Up Consult  Pharmacy Consult for Heparin Indication:  Atrial fibrillation. Hx DVT/PE  Allergies  Allergen Reactions  . Codeine     REACTION: "feels like walking outside her body", itching  . Erythromycin     REACTION: hives, sob, diff. breathing  . Zolpidem Tartrate     REACTION: keeps her awake, shaking    Patient Measurements: Height: 4\' 11"  (149.9 cm) Weight: 109 lb 5.6 oz (49.6 kg) IBW/kg (Calculated) : 43.2   Vital Signs: Temp: 98.6 F (37 C) (12/12 0400) Temp src: Oral (12/12 0400) BP: 117/62 mmHg (12/12 0345) Pulse Rate: 104  (12/12 0345)  Labs:  Basename 09/11/11 0336 09/11/11 0308 09/10/11 1743 09/10/11 0845 09/10/11 0535 09/09/11 0525 09/08/11 0510  HGB -- 8.6* -- -- 8.4* -- --  HCT -- 29.5* -- -- 28.4* 30.2* --  PLT -- 271 -- -- 269 327 --  APTT -- -- -- -- -- -- --  LABPROT -- -- -- -- -- 15.1 15.4*  INR -- -- -- -- -- 1.17 1.19  HEPARINUNFRC 0.33 -- 0.24* 0.13* -- -- --  CREATININE -- -- -- -- 0.35* 0.38* 0.39*  CKTOTAL -- -- -- -- -- -- --  CKMB -- -- -- -- -- -- --  TROPONINI -- -- -- -- -- -- --   Estimated Creatinine Clearance: 51.6 ml/min (by C-G formula based on Cr of 0.35).   Assessment: 60yo female now therapeutic on heparin after multiple rate increases.  Goal of Therapy:     Heparin level 0.3-0.7   Plan:  Will continue heparin at current rate and confirm stable with additional level.  Colleen Can PharmD BCPS 09/11/2011,4:36 AM

## 2011-09-11 NOTE — Progress Notes (Signed)
ANTICOAGULATION CONSULT NOTE - Follow Up Consult  Pharmacy Consult for Heparin Indication:  Atrial fibrillation. Hx DVT/PE  Allergies  Allergen Reactions  . Codeine     REACTION: "feels like walking outside her body", itching  . Erythromycin     REACTION: hives, sob, diff. breathing  . Zolpidem Tartrate     REACTION: keeps her awake, shaking    Patient Measurements: Height: 4\' 11"  (149.9 cm) Weight: 109 lb 5.6 oz (49.6 kg) IBW/kg (Calculated) : 43.2   Vital Signs: Temp: 98.4 F (36.9 C) (12/12 1250) Temp src: Oral (12/12 1250) BP: 154/86 mmHg (12/12 1242) Pulse Rate: 110  (12/12 1250)  Labs:  Basename 09/11/11 1336 09/11/11 1202 09/11/11 0336 09/11/11 0308 09/10/11 0535 09/09/11 0525  HGB -- -- -- 8.6* 8.4* --  HCT -- -- -- 29.5* 28.4* 30.2*  PLT -- -- -- 271 269 327  APTT -- -- -- -- -- --  LABPROT -- -- -- -- -- 15.1  INR -- -- -- -- -- 1.17  HEPARINUNFRC 0.34 <0.10* 0.33 -- -- --  CREATININE -- -- -- -- 0.35* 0.38*  CKTOTAL -- -- -- -- -- --  CKMB -- -- -- -- -- --  TROPONINI -- -- -- -- -- --   Estimated Creatinine Clearance: 51.6 ml/min (by C-G formula based on Cr of 0.35).   Assessment: 60yo female on IV heparin for Afib and history of DVT/PE as well as s/p pericardial window 5 days ago. Repeat heparin level is therapeutic for goal. No bleeding reported per RN. Lines infusing well.   Goal of Therapy:     Heparin level 0.3-0.7   Plan:  Continue heparin at current rate of 1400units/hr (46ml/hr). Will follow-up level in am.   Fayne Norrie PharmD BCPS 09/11/2011,2:39 PM

## 2011-09-12 DIAGNOSIS — I369 Nonrheumatic tricuspid valve disorder, unspecified: Secondary | ICD-10-CM

## 2011-09-12 LAB — LACTATE DEHYDROGENASE: LDH: 230 U/L (ref 94–250)

## 2011-09-12 LAB — GLUCOSE, CAPILLARY
Glucose-Capillary: 113 mg/dL — ABNORMAL HIGH (ref 70–99)
Glucose-Capillary: 151 mg/dL — ABNORMAL HIGH (ref 70–99)
Glucose-Capillary: 119 mg/dL — ABNORMAL HIGH (ref 70–99)

## 2011-09-12 LAB — CBC
HCT: 29.3 % — ABNORMAL LOW (ref 36.0–46.0)
Hemoglobin: 8.4 g/dL — ABNORMAL LOW (ref 12.0–15.0)
RBC: 3.23 MIL/uL — ABNORMAL LOW (ref 3.87–5.11)
WBC: 13.7 10*3/uL — ABNORMAL HIGH (ref 4.0–10.5)

## 2011-09-12 MED ORDER — ALPRAZOLAM 0.5 MG PO TABS
0.5000 mg | ORAL_TABLET | Freq: Three times a day (TID) | ORAL | Status: DC | PRN
  Administered 2011-09-12 – 2011-09-13 (×2): 0.5 mg via ORAL
  Filled 2011-09-12: qty 1

## 2011-09-12 MED ORDER — ALPRAZOLAM 0.5 MG PO TABS: 1.0000 mg | ORAL_TABLET | Freq: Three times a day (TID) | ORAL | Status: DC | PRN

## 2011-09-12 MED ORDER — ALPRAZOLAM 0.5 MG PO TABS
0.5000 mg | ORAL_TABLET | Freq: Four times a day (QID) | ORAL | Status: DC
  Administered 2011-09-12: 0.5 mg via ORAL
  Filled 2011-09-12: qty 1

## 2011-09-12 NOTE — Consult Note (Signed)
Reason for consult:Metastatic Adenocarcinoma/Malignant pericardial effusion Consulting MD: Dr. Arline Asp Date of Consultation: 09/12/2011  HPI: [name] with a h/o tobacco abuse,and a recent R LE DVT 08/2011 and PE on chronic anticoagulation,admitted to [facility] with SOB, weakness. CXR was remarkable for large Right upper lobe mass worrisome for malignancy, as well as a pericardial tamponade. CT angio of the chest with contrast 12/06 confirmed a very large pericardial effusion, RUL mass measuring 4.5x4.4cm, enlarged R hilar node measuring 2.1x3.1cm, enlarged B mediastinal lymph nodes of about 1.4 cm, especially on the L side of the aortic arch,. No enlarged axillary or supraclavicular lymphadenopathy. L lung with no enlarged ln or masses. No osseous abnormalities within the chest. Upper abdomen negative for masses.  Pt underwent emergent pericardiocentesis on 09/05/2011 was performed by Dr. Andee Lineman yielding 300cc bloody fuid Pathology of fluid (case number [id number]) was consistent with Adenocarcinoma. I.H. Stains were strongly and diffusely positive for MOC-31 and completely negative for calretinin.Pending stains include TTF-1 and NApsin A. The block material was sent for EGFR and ALK tests. She was transferred to [facility] at which time she had a pericardial window on 09/06/2011 (Dr. Dorris Fetch). While recuperating, we were asked to see Ms. Pollak in consultation with recommendations regarding her care.   PMH: Past Medical History  Diagnosis Date  . COPD (chronic obstructive pulmonary disease)   . Hypertension   . Diabetes mellitus   . Atrial fibrillation 2008  . Pulmonary embolism 2012  . DVT (deep venous thrombosis) Nov. 2012  . Atrial fibrillation 2008  . Pneumonia 2012  . Pulmonary fibrosis   . Ejection fraction     EF 55-60%, echo, December, 2012  . Lung mass     right hilar - 4.5.4.4cm 09/05/11 CT   h/o cervical cancer diagnosed in 1991, s/p TAH. No chemo or XRT    Surgeries: Past Surgical History  Procedure Date  . Cholecystectomy   . Pericardial window 09/06/2011    Procedure: PERICARDIAL WINDOW;  Surgeon: Loreli Slot, MD;  [facility];  Service: Thoracic;  Laterality: N/A;  S/p TAH in [place]  In 1991  Allergies:  Allergies  Allergen Reactions  . Codeine     REACTION: "feels like walking outside her body", itching  . Erythromycin     REACTION: hives, sob, diff. breathing  . Zolpidem Tartrate     REACTION: keeps her awake, shaking    Medications:   Scheduled:    . ALPRAZolam  0.5 mg Oral Q6H WA  . antiseptic oral rinse  15 mL Mouth Rinse BID  . digoxin  0.25 mg Oral Daily  . feeding supplement  1 Container Oral BID  . Fluticasone-Salmeterol  1 puff Inhalation BID  . levofloxacin  500 mg Oral Daily  . loratadine  10 mg Oral Daily  . metoCLOPramide  5 mg Oral TID AC & HS  . montelukast  10 mg Oral QHS  . pantoprazole  40 mg Oral Daily  . potassium chloride  20 mEq Oral Daily  . sodium chloride  3 mL Intravenous Q12H  . tiotropium  18 mcg Inhalation Daily  . venlafaxine  75 mg Oral Q breakfast  . DISCONTD: ALPRAZolam  0.5 mg Oral TID   RUE:AVWUJW chloride, acetaminophen, acetaminophen, albuterol, ALPRAZolam, morphine, morphine, morphine injection, ondansetron (ZOFRAN) IV, oxyCODONE-acetaminophen, promethazine, sodium chloride  JXB:JYNWGN of Systems  Constitutional: Positive for weight loss, of about 60 lbs in 1 year, with decreased appetite. Negative for fever, chills and positive for  malaise/fatigue.  No night sweats. Eyes: Negative for blurred vision and double vision.  Respiratory: Positive for non productive  cough,  No hemoptysis and positive for shortness of breath.   Cardiovascular: Negative for chest pain. GI: No nausea, vomiting, diarrhea, constipation. No change in bowel caliber. No  Melena or  Hematochezia.  GU: No blood in urine. No loss of urinary control. Skin: Negative for itching. No  rash. No petechia. Chronic bruising due to coumadin. Neurological: No headaches. No motor or sensory deficits.  Family History: Mother deceased with lung cancer. Father died with a stroke. 2 sisters, one with COPD and the other in fairly good health. One brother in good health. Patient is divorced. She has 3 children a/w. Pt lives in [place], [place]. 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   Social History:  Lifelong smoker, quit 2 months ago. She used to smoke 1 1/2 to 2 ppd since very young age. Denies recreational drug use. "social drinker".  Physical Exam  60 y.o. female in no acute distress  A. and O. x3 Flat affect  HEENT: Normocephalic, atraumatic, PERRLA. Oral cavity without thrush or lesions. Neck: supple. no thyromegaly, no cervical or supraclavicular adenopathy  Lungs: clear bilaterally . Minimal  Wheezing. B rhonchi, especially on the R lung. No rales. No axillary masses. Breasts: not examined. Cardiac:irregularly irregular  rate and rhythm normal S1-S2, no murmur , rubs or gallops Abdomen: soft nontender , bowel sounds x4  no hepatosplenomegaly GU/rectal: deferred. Extremities: no clubbing cyanosis . No edema. Some  bruising in arms and some areas on her chest. No  petechial rash Neurologic: A. and O. x3, no focal deficits       Labs  CBC   Lab 09/12/11 0510 09/11/11 0308 09/10/11 0535 09/09/11 0525 09/08/11 0510 09/06/11 1250  WBC 13.7* 13.8* 14.3* 17.1* 19.0* --  HGB 8.4* 8.6* 8.4* 9.1* 8.3* --  HCT 29.3* 29.5* 28.4* 30.2* 27.5* --  PLT 283 271 269 327 364 --  MCV 90.7 91.0 89.9 88.8 88.7 --  MCH 26.0 26.5 26.6 26.8 26.8 --  MCHC 28.7* 29.2* 29.6* 30.1 30.2 --  RDW 15.6* 15.7* 15.6* 15.9* 16.3* --  LYMPHSABS -- -- -- -- -- 0.8  MONOABS -- -- -- -- -- 1.8*  EOSABS -- -- -- -- -- 0.0  BASOSABS -- -- -- -- -- 0.0  BANDABS -- -- -- -- -- --    CMP    Lab 09/10/11 0535 09/09/11 0525 09/08/11 0510 09/07/11 0409 09/06/11 1250  NA 143 139 138 135 136  K 3.7 3.8 3.4*  3.8 4.0  CL 95* 94* 94* 93* 92*  CO2 41* 38* 37* 33* 34*  GLUCOSE 97 98 83 107* 98  BUN 3* 5* 8 10 14   CREATININE 0.35* 0.38* 0.39* 0.42* 0.43*  CALCIUM 8.8 8.9 8.6 8.4 8.9  MG -- -- -- -- 2.0  AST -- -- -- -- 752*  ALT -- -- -- -- 1482*  ALKPHOS -- -- -- -- 75  BILITOT -- -- -- -- 0.4        Component Value Date/Time   BILITOT 0.4 09/06/2011 1250    Anemia panel: No results found for this basename: VITAMINB12:2,FOLATE:2,FERRITIN:2,TIBC:2,IRON:2,RETICCTPCT:2 in the last 72 hours  No results found for this basename: TSH,T4TOTAL,FREET3,T3FREE,THYROIDAB in the last 72 hours   No results found for this basename: esrsedrate     Lab 09/09/11 0525 09/08/11 0510 09/07/11 0409 09/06/11 1250  INR 1.17 1.19 1.65* 2.23*  PROTIME -- -- -- --  No results found for this basename: DDIMER:2 in the last 72 hours  Imaging Studies: Dg Chest Port 1 View  09/11/2011  *RADIOLOGY REPORT*  Clinical Data: Short of breath  PORTABLE CHEST - 1 VIEW  Comparison: 09/09/2011  Findings: Improvement in bilateral airspace disease which may be due to resolving pulmonary edema.  There remains bibasilar airspace disease and pneumonia is not excluded.  There are small effusions bilaterally.  Right upper lobe density is unchanged from the  prior study and may represent carcinoma.  Chest CT is suggested.  IMPRESSION: Improving bilateral airspace disease suggesting improving edema. There remains atelectasis and effusions bilaterally, left greater than right.  Right upper lobe mass lesion again present.  CT chest suggested for further evaluation.  Original Report Authenticated By: Camelia Phenes, M.D.        A/P: 60 y.o. female asked to see for evaluation of adenocarcinoma, likely lung primary, as she presented to [facility] with pericardial tamponade requiring pericardiocentesis, with reacummulation, for which she was transferred to [facility], where she had a pericardial window, and she is  recuperating well.  Dr.   Arline Asp  is to see the patient following this consult with recommendations regarding diagnosis and further workup studies to help in her staging, which will include Bone Scan, C/T A/P with contrast, MRI of the brain w/without contrast, CEA and LDH.  Orders written and approved.Ms. Ravenscroft wishes continue her care in either [place] or [place] upon discharge. In the interim, will wait for results and proceed with further plans.  [facility] E 09/12/2011 4:14 PM     Patient seen and examined.  Chart and chest CT scan  From 12/6 reviewed.  I have had an in depth and candid discussion with the patient and her 2 daughters.  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  Pt and family were told that the tumor is inoperable and incurable.  The patient lives in [place], [place] and is almost always with an Geophysicist/field seismologist.  Any treatment she might receive would be administered at the [facility], about 20 minutes away. Under ideal circumstances, with a pt with a good performance status, we would treat locally advanced lung cancer with combined radiation and chemotherapy.  Mrs. Carrera, at this point, does not appear to be strong enough to undergo these treatments.  Hopefully her condition will improve in the days ahead.  For now, I would recommend vigorous PT/OT, mobilization, nutritional encouragement.  We can complete staging with CTs, brain MRI, bone scan and labs/CEA.  Of note were the markedly elevated AST and ALT on admission--?liver congestion from the tamponade.  If pt has brain mets, she may require more urgent therapy,  e.g. Decadron and brain XRT.  Pt is currently on heparin drip for her DVT/history of pulmonary emboli/hypercoagulable state.  I would recommend conversion to lovenox, rather than coumadin.  If there are logistical/insurance issues or bleeding problems which preclude lovenox, then pt may require an IVC filter.  Of note, tumor is being tested for EGFR and the ALK translocation.  In general, outlook here is not very favorable.  Pt has a limited code status presently.  Feel free to contact me on pager [phone] if any questions or concerns.   Samul Dada

## 2011-09-12 NOTE — Progress Notes (Signed)
Patient ID: Kristen Keller, female   DOB: Apr 20, 1951, 60 y.o.   MRN: [id number] Feels better today Upset by news that she does in fact have cancer Mildly tachy, BP OK Wound with minimal serous drainage Echo- no significant residual effusion Moving to tele bed

## 2011-09-12 NOTE — Progress Notes (Signed)
ANTICOAGULATION CONSULT NOTE - Follow Up Consult  Pharmacy Consult for Heparin Indication:  Atrial fibrillation. Hx DVT/PE  Allergies  Allergen Reactions  . Codeine     REACTION: "feels like walking outside her body", itching  . Erythromycin     REACTION: hives, sob, diff. breathing  . Zolpidem Tartrate     REACTION: keeps her awake, shaking    Patient Measurements: Height: 4\' 11"  (149.9 cm) Weight: 106 lb 11.2 oz (48.4 kg) IBW/kg (Calculated) : 43.2   Vital Signs: Temp: 98.9 F (37.2 C) (12/13 0756) Temp src: Oral (12/13 0756) BP: 114/49 mmHg (12/13 0800) Pulse Rate: 106  (12/13 0800)  Labs:  Basename 09/12/11 0510 09/11/11 1336 09/11/11 1202 09/11/11 0308 09/10/11 0535  HGB 8.4* -- -- 8.6* --  HCT 29.3* -- -- 29.5* 28.4*  PLT 283 -- -- 271 269  APTT -- -- -- -- --  LABPROT -- -- -- -- --  INR -- -- -- -- --  HEPARINUNFRC 0.46 0.34 <0.10* -- --  CREATININE -- -- -- -- 0.35*  CKTOTAL -- -- -- -- --  CKMB -- -- -- -- --  TROPONINI -- -- -- -- --   Estimated Creatinine Clearance: 51.6 ml/min (by C-G formula based on Cr of 0.35).   Assessment: 60yo female on IV heparin for Afib, hx of DVT/PE, and s/p pericardial window 6 days ago. Heparin Level 0.46 - therapeutic.  No bleeding reported per RN. Lines infusing well.   Goal of Therapy:     Heparin level 0.3-0.7   Plan:  Continue heparin at current rate of 1400units/hr (9ml/hr). Will follow-up level in am.   Haynes Hoehn E PharmD BCPS 09/12/2011,9:04 AM

## 2011-09-12 NOTE — Progress Notes (Signed)
SUBJECTIVE: Mild chest pain with deep inspiration. Breathing is ok. No events.   BP 114/49  Pulse 106  Temp(Src) 98.9 F (37.2 C) (Oral)  Resp 26  Ht 4\' 11"  (1.499 m)  Wt 106 lb 11.2 oz (48.4 kg)  BMI 21.55 kg/m2  SpO2 93%  Intake/Output Summary (Last 24 hours) at 09/12/11 1108 Last data filed at 09/12/11 0700  Gross per 24 hour  Intake    667 ml  Output   2550 ml  Net  -1883 ml    PHYSICAL EXAM General: Well developed, well nourished, in no acute distress. Alert and oriented x 3.  Psych:  Good affect, responds appropriately Neck: No JVD. No masses noted.  Lungs: Clear bilaterally with no wheezes or rhonci noted.  Heart: RRR with no murmurs noted. Abdomen: Bowel sounds are present. Soft, non-tender.  Extremities: No lower extremity edema.   LABS: Basic Metabolic Panel:  Basename 09/10/11 0535  NA 143  K 3.7  CL 95*  CO2 41*  GLUCOSE 97  BUN 3*  CREATININE 0.35*  CALCIUM 8.8  MG --  PHOS --   CBC:  Basename 09/12/11 0510 09/11/11 0308  WBC 13.7* 13.8*  NEUTROABS -- --  HGB 8.4* 8.6*  HCT 29.3* 29.5*  MCV 90.7 91.0  PLT 283 271    Current Meds:    . ALPRAZolam  0.5 mg Oral TID  . antiseptic oral rinse  15 mL Mouth Rinse BID  . digoxin  0.25 mg Oral Daily  . feeding supplement  1 Container Oral BID  . Fluticasone-Salmeterol  1 puff Inhalation BID  . levofloxacin  500 mg Oral Daily  . loratadine  10 mg Oral Daily  . metoCLOPramide  5 mg Oral TID AC & HS  . montelukast  10 mg Oral QHS  . pantoprazole  40 mg Oral Daily  . potassium chloride  20 mEq Oral Daily  . sodium chloride  3 mL Intravenous Q12H  . tiotropium  18 mcg Inhalation Daily  . venlafaxine  75 mg Oral Q breakfast     ASSESSMENT AND PLAN:  1. Pericardial Effusion: S/p pericardial window. CT surgery is following. Cytology c/w adenocarcinoma.  No evidence of any significant pericardial effusion on echo.   2. Right Lung Mass: Fluid cytology has returned and is c/w adenocarcinoma.   Pulmonary has seen her but signed off. We have consulted Oncology for treatment recommendations.   3. DVT/PE: Cont heparin per pharmacy. Will not restart coumadin until plans for treatment of cancer defined. If a biopsy is not needed, this could be restarted.   4. Hypotension: Resolved. BP stable.   5. Dispo: She has no active cardiac issues. Her hospitalization is driven by her pericardial effusion which is most likely from malignancy with hilar adenopathy and a lung mass. Fluid c/w adenocarcinoma.  Pulmonary and CT surgery have been involved. Oncology consult pending. Transfer to telemetry today.    Kristen Keller  12/13/201211:08 AM

## 2011-09-13 ENCOUNTER — Inpatient Hospital Stay (HOSPITAL_COMMUNITY): Payer: Medicare Other

## 2011-09-13 ENCOUNTER — Other Ambulatory Visit (HOSPITAL_COMMUNITY): Payer: Self-pay | Admitting: Radiology

## 2011-09-13 DIAGNOSIS — I2699 Other pulmonary embolism without acute cor pulmonale: Secondary | ICD-10-CM

## 2011-09-13 LAB — COMPREHENSIVE METABOLIC PANEL
ALT: 115 U/L — ABNORMAL HIGH (ref 0–35)
Alkaline Phosphatase: 68 U/L (ref 39–117)
BUN: 4 mg/dL — ABNORMAL LOW (ref 6–23)
CO2: 41 mEq/L (ref 19–32)
GFR calc Af Amer: >90 mL/min (ref >90)
GFR calc non Af Amer: >90 mL/min (ref >90)
Glucose, Bld: 91 mg/dL (ref 70–99)
Potassium: 4 mEq/L (ref 3.5–5.1)
Sodium: 141 mEq/L (ref 135–145)
Total Bilirubin: 0.3 mg/dL (ref 0.3–1.2)

## 2011-09-13 LAB — CBC
HCT: 31.6 % — ABNORMAL LOW (ref 36.0–46.0)
HCT: 29.7 % — ABNORMAL LOW (ref 36.0–46.0)
Hemoglobin: 9.1 g/dL — ABNORMAL LOW (ref 12.0–15.0)
MCHC: 28.8 g/dL — ABNORMAL LOW (ref 30.0–36.0)
MCHC: 29.3 g/dL — ABNORMAL LOW (ref 30.0–36.0)
MCV: 90.5 fL (ref 78.0–100.0)
Platelets: 277 10*3/uL (ref 150–400)
RDW: 15.4 % (ref 11.5–15.5)
RDW: 15.4 % (ref 11.5–15.5)
WBC: 12.9 10*3/uL — ABNORMAL HIGH (ref 4.0–10.5)
WBC: 12.2 10*3/uL — ABNORMAL HIGH (ref 4.0–10.5)

## 2011-09-13 LAB — BASIC METABOLIC PANEL
BUN: 5 mg/dL — ABNORMAL LOW (ref 6–23)
Calcium: 9.4 mg/dL (ref 8.4–10.5)
Creatinine, Ser: 0.39 mg/dL — ABNORMAL LOW (ref 0.50–1.10)
GFR calc Af Amer: >90 mL/min (ref >90)
GFR calc non Af Amer: >90 mL/min (ref >90)

## 2011-09-13 MED ORDER — IOHEXOL 300 MG/ML  SOLN: 100.0000 mL | Freq: Once | INTRAMUSCULAR | Status: AC | PRN

## 2011-09-13 MED ORDER — ALPRAZOLAM 0.5 MG PO TABS
1.0000 mg | ORAL_TABLET | Freq: Three times a day (TID) | ORAL | Status: DC
  Administered 2011-09-13 – 2011-09-19 (×18): 1 mg via ORAL
  Filled 2011-09-13 (×6): qty 2
  Filled 2011-09-13: qty 1
  Filled 2011-09-13 (×6): qty 2
  Filled 2011-09-13: qty 1
  Filled 2011-09-13 (×3): qty 2
  Filled 2011-09-13: qty 1
  Filled 2011-09-13 (×2): qty 2
  Filled 2011-09-13: qty 1

## 2011-09-13 MED ORDER — ENOXAPARIN SODIUM 60 MG/0.6ML ~~LOC~~ SOLN
45.0000 mg | Freq: Two times a day (BID) | SUBCUTANEOUS | Status: DC
  Administered 2011-09-13 – 2011-09-19 (×13): 45 mg via SUBCUTANEOUS
  Filled 2011-09-13 (×17): qty 0.6

## 2011-09-13 NOTE — Progress Notes (Addendum)
I spent time this afternoon with Kristen Keller & her daughter at the request of Dr. Tenny Craw to clarify what the patient ultimately wanted. Today she refused further testing with MRI of head/bone scan because she needed time to think about it and also was afraid of laying flat 2/2 SOB. Then she changed her mind and wanted to be discharged. Note that today's CT scan does show large pleural effusion. Ultimately, the patient would like to remain in the hospital to attempt further studies possibly this weekend, which she & the daughter state per oncology was the original aim. [name]' daughter notes that her dad died of renal cell carcinoma in his 9's, so perhaps some of this is related to anxiety as well. Dr. Lavera Guise has graciously offered to take her on their service as primary since her cardiology issues are secondary at this point. The patient and her daughter are very agreeable to this idea and the patient would like to stay. We will be available as needed - please call with questions.  Kriste Basque Dunn PA-C 09/13/2011 4:39 PM

## 2011-09-13 NOTE — Consult Note (Signed)
Reason for consult: Multiple medical problems, new diagnosis of malignancy, chronic opiate and benzodiazepine use Requesting MD : Kristen Keller Date of Consultation: 09/13/2011  HPI: [name] with a h/o tobacco abuse,and a recent R LE DVT 08/2011 and PE on chronic anticoagulation,admitted to [facility] with SOB, weakness. CXR was remarkable for large Right upper lobe mass worrisome for malignancy, as well as pericardial tamponade. CT angio of the chest with contrast 12/06 confirmed a very large pericardial effusion, RUL mass measuring 4.5x4.4cm, enlarged R hilar node measuring 2.1x3.1cm, enlarged B mediastinal lymph nodes of about 1.4 cm, especially on the L side of the aortic arch Pt underwent emergent pericardiocentesis on 09/05/2011 was performed by Dr. Andee Lineman yielding 300cc bloody fuid Pathology of fluid  was consistent with Adenocarcinoma.  She was transferred to [facility] at which time she had a pericardial window on 09/06/2011 (Dr. Dorris Fetch).  Patient has been improving nicely and she was transferred out of the intensive care unit on September 13, 2011. We are asked to consult for her multiple medical problems and consider taking the patient on our service  PMH: Past Medical History  Diagnosis Date  . COPD (chronic obstructive pulmonary disease)   . Hypertension   . Diabetes mellitus   . Atrial fibrillation 2008  . Pulmonary embolism 2012  . DVT (deep venous thrombosis) Nov. 2012  . Atrial fibrillation 2008  . Pneumonia 2012  . Pulmonary fibrosis   . Ejection fraction     EF 55-60%, echo, December, 2012  . Lung mass     right hilar - 4.5.4.4cm 09/05/11 CT   h/o cervical cancer diagnosed in 1991, s/p TAH. No chemo or XRT   Surgeries: Past Surgical History  Procedure Date  . Cholecystectomy   . Pericardial window 09/06/2011    Procedure: PERICARDIAL WINDOW;  Surgeon: Loreli Slot, MD;  Location: [facility] OR;  Service: Thoracic;  Laterality: N/A;  S/p TAH in [place]   In 1991  Allergies:  Allergies  Allergen Reactions  . Codeine     REACTION: "feels like walking outside her body", itching  . Erythromycin     REACTION: hives, sob, diff. breathing  . Zolpidem Tartrate     REACTION: keeps her awake, shaking    Medications:   Scheduled:    . ALPRAZolam  1 mg Oral TID  . antiseptic oral rinse  15 mL Mouth Rinse BID  . digoxin  0.25 mg Oral Daily  . enoxaparin (LOVENOX) injection  45 mg Subcutaneous Q12H  . feeding supplement  1 Container Oral BID  . Fluticasone-Salmeterol  1 puff Inhalation BID  . levofloxacin  500 mg Oral Daily  . loratadine  10 mg Oral Daily  . metoCLOPramide  5 mg Oral TID AC & HS  . montelukast  10 mg Oral QHS  . pantoprazole  40 mg Oral Daily  . potassium chloride  20 mEq Oral Daily  . sodium chloride  3 mL Intravenous Q12H  . tiotropium  18 mcg Inhalation Daily  . venlafaxine  75 mg Oral Q breakfast  . DISCONTD: ALPRAZolam  0.5 mg Oral Q6H WA   ZOX:WRUEAV chloride, acetaminophen, acetaminophen, albuterol, iohexol, morphine, ondansetron (ZOFRAN) IV, oxyCODONE-acetaminophen, promethazine, sodium chloride, DISCONTD: ALPRAZolam, DISCONTD: ALPRAZolam, DISCONTD: morphine, DISCONTD:  morphine injection  WUJ:WJXBJY of Systems  Constitutional: Positive for weight loss, of about 60 lbs in 1 year, with decreased appetite. Negative for fever, chills and positive for  malaise/fatigue. No night sweats. Eyes: Negative for blurred vision and double  vision.  Respiratory: Positive for non productive  cough,  No hemoptysis and positive for shortness of breath.   Cardiovascular: Negative for chest pain. GI: No nausea, vomiting, diarrhea, constipation. No change in bowel caliber. No  Melena or  Hematochezia.  GU: No blood in urine. No loss of urinary control. Skin: Negative for itching. No rash. No petechia. Chronic bruising due to coumadin. Neurological: No headaches. No motor or sensory deficits.  Family History: Mother deceased with  lung cancer. Father died with a stroke. 2 sisters, one with COPD and the other in fairly good health. One brother in good health. Patient is divorced. She has 3 children a/w. Pt lives in [place], [place]. 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   Social History:  Lifelong smoker, quit 2 months ago. She used to smoke 1 1/2 to 2 ppd since very young age. Denies recreational drug use. "social drinker".  Physical Exam  60 y.o. female in no acute distress  A. and O. x3  HEENT: Normocephalic, atraumatic, PERRLA. Oral cavity without thrush or lesions. Neck: supple. no thyromegaly, no cervical or supraclavicular adenopathy  Lungs: clear bilaterally . Minimal  Wheezing. B rhonchi, especially on the R lung. No rales. No axillary masses. Breasts: not examined. Cardiac:irregularly irregular  rate and rhythm normal S1-S2, no murmur , rubs or gallops Abdomen: soft nontender , bowel sounds x4  no hepatosplenomegaly GU/rectal: deferred. Extremities: no clubbing cyanosis . No edema. Some  bruising in arms and some areas on her chest. No  petechial rash Neurologic: A. and O. x3, no focal deficits       Labs  CBC   Lab 09/13/11 1015 09/13/11 0600 09/12/11 0510 09/11/11 0308 09/10/11 0535  WBC 12.2* 12.9* 13.7* 13.8* 14.3*  HGB 8.7* 9.1* 8.4* 8.6* 8.4*  HCT 29.7* 31.6* 29.3* 29.5* 28.4*  PLT 277 285 283 271 269  MCV 90.8 90.5 90.7 91.0 89.9  MCH 26.6 26.1 26.0 26.5 26.6  MCHC 29.3* 28.8* 28.7* 29.2* 29.6*  RDW 15.4 15.4 15.6* 15.7* 15.6*  LYMPHSABS -- -- -- -- --  MONOABS -- -- -- -- --  EOSABS -- -- -- -- --  BASOSABS -- -- -- -- --  BANDABS -- -- -- -- --    CMP    Lab 09/13/11 1015 09/13/11 0600 09/10/11 0535 09/09/11 0525 09/08/11 0510  NA 138 141 143 139 138  K 4.1 4.0 3.7 3.8 3.4*  CL 93* 94* 95* 94* 94*  CO2 39* 41* 41* 38* 37*  GLUCOSE 101* 91 97 98 83  BUN 5* 4* 3* 5* 8  CREATININE 0.39* 0.45* 0.35* 0.38* 0.39*  CALCIUM 9.4 9.6 8.8 8.9 8.6  MG -- -- -- -- --  AST -- 17 -- -- --    ALT -- 115* -- -- --  ALKPHOS -- 68 -- -- --  BILITOT -- 0.3 -- -- --        Component Value Date/Time   BILITOT 0.3 09/13/2011 0600    Anemia panel:   Lab 09/09/11 0525 09/08/11 0510 09/07/11 0409  INR 1.17 1.19 1.65*  PROTIME -- -- --    No results found for this basename: DDIMER:2 in the last 72 hours  Imaging Studies: Ct Abdomen Pelvis W Contrast  09/13/2011  *RADIOLOGY REPORT*  Clinical Data: Lung mass.  Pericardial effusion.  CT ABDOMEN AND PELVIS WITH CONTRAST  Technique:  Multidetector CT imaging of the abdomen and pelvis was performed following the standard protocol during bolus administration of intravenous  contrast.  Contrast:  90 ml Omnipaque-300  Comparison: Chest x-ray dated 09/11/2011  Findings: The patient has a large left pleural effusion and a moderate right effusion with secondary compressive atelectasis of the lower lobes.  Slight thickening of the pericardium.  Recent pericardial window.  Heart size is normal.  The liver, spleen, pancreas, adrenal glands, and kidneys demonstrate no significant abnormalities.  Small benign appearing renal cysts bilaterally.  Prior cholecystectomy with secondary slight dilatation of the common hepatic and common bile duct. Two tiny nonenhancing low density lesions in the right lobe of the liver, most likely tiny cysts.  The bowel appears normal except for scattered diverticula in the distal colon.  Small amount of nonspecific free fluid in the pelvis.  Uterus has been removed.  Right ovary is normal.  The left ovary is normal.  No significant osseous abnormality.  IMPRESSION:  1.  No significant abnormality of the abdomen. No evidence of primary or metastatic tumor in the abdomen or pelvis.  2.  Large left effusion.  Moderate right effusion.  Original Report Authenticated By: Gwynn Burly, M.D.        A/P:  1. 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   Large left effussion : - Probably in conjunction with a pericardial window. Repeat chest x-ray tomorrow. Patient seems fairly asymptomatic   COPD STAGE IV -continue inhalers   -continue levaquin for total of 7 days stop date 09/15/11   Chronic hypoxic respiratory failure on home oxygen for the past 5 years -titrate oxygen to keep SpO2 > 92%   A fib, HTN   Hx of DVT, PE - on full dose Lovenox   DM - controlled    Goals of care  -limited resuscitation>>no CPR, defibrillation    We will assume care    Kristen Keller 09/13/2011 5:53 PM

## 2011-09-13 NOTE — Progress Notes (Signed)
Patient ID: Kristen Keller, female   DOB: Apr 11, 1951, 60 y.o.   MRN: [id number] Feels a little better today Afeb, VSS 96% sat on 4L [place] Wound clean and dry Has been seen by Oncology

## 2011-09-13 NOTE — Progress Notes (Signed)
Subjective: Patient anxious Wants "blue" Xanax   Takes 3x per day at home.   Denies CP.  BReathing is fair. Objective: Filed Vitals:   09/12/11 1932 09/12/11 2039 09/13/11 0700 09/13/11 0758  BP:  130/77 112/67   Pulse:  106 102   Temp:  97.2 F (36.2 C) 98.7 F (37.1 C)   TempSrc:  Oral Oral   Resp:  20 20   Height:      Weight:   104 lb 4.8 oz (47.31 kg)   SpO2: 95% 96% 96% 96%   Weight change: -2 lb 6.4 oz (-1.09 kg)  Intake/Output Summary (Last 24 hours) at 09/13/11 0909 Last data filed at 09/13/11 1610  Gross per 24 hour  Intake    816 ml  Output   2750 ml  Net  -1934 ml    General: Alert, awake,  Heart: Regular rate and rhythm, without murmurs, rubs, gallops.  Lungs: Bilat rhonchi. Abdomen: Soft, nontender, nondistended, positive bowel sounds.  Ext: No edema.   Lab Results: Results for orders placed during the hospital encounter of 09/06/11 (from the past 24 hour(s))  GLUCOSE, CAPILLARY     Status: Abnormal   Collection Time   09/12/11 11:56 AM      Component Value Range   Glucose-Capillary 151 (*) 70 - 99 (mg/dL)  CEA     Status: Normal   Collection Time   09/12/11  4:30 PM      Component Value Range   CEA 2.6  0.0 - 5.0 (ng/mL)  LACTATE DEHYDROGENASE     Status: Normal   Collection Time   09/12/11  4:30 PM      Component Value Range   LD 230  94 - 250 (U/L)  GLUCOSE, CAPILLARY     Status: Abnormal   Collection Time   09/12/11  4:40 PM      Component Value Range   Glucose-Capillary 119 (*) 70 - 99 (mg/dL)  CBC     Status: Abnormal   Collection Time   09/13/11  6:00 AM      Component Value Range   WBC 12.9 (*) 4.0 - 10.5 (K/uL)   RBC 3.49 (*) 3.87 - 5.11 (MIL/uL)   Hemoglobin 9.1 (*) 12.0 - 15.0 (g/dL)   HCT 96.0 (*) 45.4 - 46.0 (%)   MCV 90.5  78.0 - 100.0 (fL)   MCH 26.1  26.0 - 34.0 (pg)   MCHC 28.8 (*) 30.0 - 36.0 (g/dL)   RDW 09.8  11.9 - 14.7 (%)   Platelets 285  150 - 400 (K/uL)  HEPARIN LEVEL (UNFRACTIONATED)     Status: Normal   Collection Time   09/13/11  6:00 AM      Component Value Range   Heparin Unfractionated 0.46  0.30 - 0.70 (IU/mL)  COMPREHENSIVE METABOLIC PANEL     Status: Abnormal   Collection Time   09/13/11  6:00 AM      Component Value Range   Sodium 141  135 - 145 (mEq/L)   Potassium 4.0  3.5 - 5.1 (mEq/L)   Chloride 94 (*) 96 - 112 (mEq/L)   CO2 41 (*) 19 - 32 (mEq/L)   Glucose, Bld 91  70 - 99 (mg/dL)   BUN 4 (*) 6 - 23 (mg/dL)   Creatinine, Ser 8.29 (*) 0.50 - 1.10 (mg/dL)   Calcium 9.6  8.4 - 56.2 (mg/dL)   Total Protein 7.1  6.0 - 8.3 (g/dL)   Albumin 2.4 (*) 3.5 - 5.2 (g/dL)  AST 17  0 - 37 (U/L)   ALT 115 (*) 0 - 35 (U/L)   Alkaline Phosphatase 68  39 - 117 (U/L)   Total Bilirubin 0.3  0.3 - 1.2 (mg/dL)   GFR calc non Af Amer >90  >90 (mL/min)   GFR calc Af Amer >90  >90 (mL/min)    Studies/Results: Dg Chest Port 1 View  09/11/2011  *RADIOLOGY REPORT*  Clinical Data: Short of breath  PORTABLE CHEST - 1 VIEW  Comparison: 09/09/2011  Findings: Improvement in bilateral airspace disease which may be due to resolving pulmonary edema.  There remains bibasilar airspace disease and pneumonia is not excluded.  There are small effusions bilaterally.  Right upper lobe density is unchanged from the  prior study and may represent carcinoma.  Chest CT is suggested.  IMPRESSION: Improving bilateral airspace disease suggesting improving edema. There remains atelectasis and effusions bilaterally, left greater than right.  Right upper lobe mass lesion again present.  CT chest suggested for further evaluation.  Original Report Authenticated By: Camelia Phenes, M.D.    Medications: I have reviewed the patient's current medications.   Patient Active Hospital Problem List: Pericardial effusion, acute (09/09/2011)   Assessment: s/p pericardial window.  Echo yesterday without evid of signif reaccumulation.  ? Small pocket vs fat.   Plan: Follow hemodynamics.   Pulmonary embolism ()   Assessment: Switch  to lovenox.     Plan:  DVT (deep venous thrombosis) ()   Assessment:    Plan: as above. Pleural effusion, malignant (09/09/2011)   Assessment:   Plan:  Anemia (09/10/2011)   Assessment:    Plan:  Hypotension (09/10/2011)   Assessment: Improved.  Still resting tachycardia.  WIll check orthostatics later today.   Plan:  Lung mass ()   Assessment:  Appreciate input of oncology Continue with staging.  PT today.   Plan:    LOS: 7 days   Dietrich Pates 09/13/2011, 9:09 AM

## 2011-09-13 NOTE — Progress Notes (Signed)
ANTICOAGULATION CONSULT NOTE - Follow Up Consult  Pharmacy Consult for Lovenox Indication:  Atrial fibrillation. Recent DVT/PE  Allergies  Allergen Reactions  . Codeine     REACTION: "feels like walking outside her body", itching  . Erythromycin     REACTION: hives, sob, diff. breathing  . Zolpidem Tartrate     REACTION: keeps her awake, shaking    Patient Measurements: Height: 4\' 11"  (149.9 cm) Weight: 104 lb 4.8 oz (47.31 kg) IBW/kg (Calculated) : 43.2   Vital Signs: Temp: 98.7 F (37.1 C) (12/14 0700) Temp src: Oral (12/14 0700) BP: 112/67 mmHg (12/14 0700) Pulse Rate: 102  (12/14 0700)  Labs:  Basename 09/13/11 0600 09/12/11 0510 09/11/11 1336 09/11/11 0308  HGB 9.1* 8.4* -- --  HCT 31.6* 29.3* -- 29.5*  PLT 285 283 -- 271  APTT -- -- -- --  LABPROT -- -- -- --  INR -- -- -- --  HEPARINUNFRC 0.46 0.46 0.34 --  CREATININE 0.45* -- -- --  CKTOTAL -- -- -- --  CKMB -- -- -- --  TROPONINI -- -- -- --   Estimated Creatinine Clearance: 51.6 ml/min (by C-G formula based on Cr of 0.45).   Assessment: 60yo female to change to Lovenox for Afib, with recent DVT/PE, and s/p pericardial window.   Goal of Therapy:     Lovenox 1mg /kg/12h.   Plan:  D/c IV heparin and start Lovenox 45mg /12h. When to resume Coumadin?  Pasty Spillers PharmD BCPS 09/13/2011,9:44 AM

## 2011-09-13 NOTE — Progress Notes (Signed)
Utilization Review Completed.Kedric Bumgarner T12/14/2012   

## 2011-09-14 ENCOUNTER — Other Ambulatory Visit (HOSPITAL_COMMUNITY): Payer: Medicare Other

## 2011-09-14 ENCOUNTER — Inpatient Hospital Stay (HOSPITAL_COMMUNITY): Payer: Medicare Other

## 2011-09-14 MED ORDER — OXYCODONE-ACETAMINOPHEN 5-325 MG PO TABS
1.0000 | ORAL_TABLET | ORAL | Status: DC | PRN
  Administered 2011-09-14 – 2011-09-15 (×4): 1 via ORAL
  Administered 2011-09-16 – 2011-09-19 (×6): 2 via ORAL
  Filled 2011-09-14: qty 1
  Filled 2011-09-14 (×2): qty 2
  Filled 2011-09-14 (×2): qty 1
  Filled 2011-09-14 (×3): qty 2
  Filled 2011-09-14 (×2): qty 1
  Filled 2011-09-14: qty 2

## 2011-09-14 MED ORDER — ALBUTEROL SULFATE (5 MG/ML) 0.5% IN NEBU
2.5000 mg | INHALATION_SOLUTION | Freq: Two times a day (BID) | RESPIRATORY_TRACT | Status: DC
  Administered 2011-09-16: 2.5 mg via RESPIRATORY_TRACT
  Filled 2011-09-14 (×3): qty 0.5

## 2011-09-14 NOTE — Progress Notes (Signed)
Physical Therapy Evaluation Patient Details Name: Kristen Keller MRN: [id number] DOB: 09-30-1951 Today's Date: 09/14/2011  Problem List:  Patient Active Problem List  Diagnoses  . HYPERTENSION, UNSPECIFIED  . ATRIAL FIBRILLATION  . COPD  . TOBACCO ABUSE, HX OF  . Encounter for long-term (current) use of anticoagulants  . COPD (chronic obstructive pulmonary disease)  . Hypertension  . Pulmonary embolism  . DVT (deep venous thrombosis)  . Pneumonia  . Pulmonary fibrosis  . Pericardial effusion, acute  . Pleural effusion, malignant  . Anemia  . Hypotension  . Ejection fraction  . Lung mass    Past Medical History:  Past Medical History  Diagnosis Date  . COPD (chronic obstructive pulmonary disease)   . Hypertension   . Diabetes mellitus   . Atrial fibrillation 2008  . Pulmonary embolism 2012  . DVT (deep venous thrombosis) unk  . Atrial fibrillation 2008  . Pneumonia   . Pulmonary fibrosis   . Ejection fraction     EF 55-60%, echo, December, 2012  . Lung mass     right hilar - 4.5.4.4cm 09/05/11 CT   Past Surgical History:  Past Surgical History  Procedure Date  . Cholecystectomy   . Pericardial window 09/06/2011    Procedure: PERICARDIAL WINDOW;  Surgeon: Loreli Slot, MD;  Location: [facility] OR;  Service: Thoracic;  Laterality: N/A;    PT Assessment/Plan/Recommendation PT Assessment Clinical Impression Statement: Patient is s/p pericardial window with decreased mobility secondary to DOE and poor endurance.  Will benefit from PT to address balance and endurance issues.  Has 24 hour care at D/C.  May benefit from a RW and 3N1 as well as HHPT f/u.   PT Recommendation/Assessment: Patient will need skilled PT in the acute care venue PT Problem List: Decreased activity tolerance;Decreased mobility;Decreased balance;Decreased knowledge of use of DME;Decreased safety awareness;Cardiopulmonary status limiting activity PT Therapy Diagnosis : Difficulty  walking;Generalized weakness PT Plan PT Frequency: Min 3X/week PT Treatment/Interventions: DME instruction;Gait training;Functional mobility training;Therapeutic activities;Therapeutic exercise;Balance training;Patient/family education PT Recommendation Follow Up Recommendations: Home health PT;24 hour supervision/assistance Equipment Recommended: Rolling walker with 5" wheels;3 in 1 bedside comode PT Goals  Acute Rehab PT Goals PT Goal Formulation: With patient Time For Goal Achievement: 2 weeks Pt will go Supine/Side to Sit: Independently PT Goal: Supine/Side to Sit - Progress: Other (comment) Pt will go Sit to Stand: with supervision PT Goal: Sit to Stand - Progress: Other (comment) Pt will Ambulate: 16 - 50 feet;with min assist;with least restrictive assistive device PT Goal: Ambulate - Progress: Other (comment) Pt will Perform Home Exercise Program: with supervision, verbal cues required/provided PT Goal: Perform Home Exercise Program - Progress: Other (comment)  PT Evaluation Precautions/Restrictions  Precautions Precautions: Fall Required Braces or Orthoses: No Restrictions Weight Bearing Restrictions: No Prior Functioning  Home Living Lives With: Family Receives Help From: Family;Personal care attendant (6 days a week 24 hour care) Type of Home: House Home Layout: One level Home Access: Ramped entrance Entrance Stairs-Rails: None Entrance Stairs-Number of Steps: 1 Bathroom Shower/Tub:  (Sponge bathes) Bathroom Toilet: Standard Bathroom Accessibility: Yes How Accessible: Accessible via walker Home Adaptive Equipment: None Prior Function Level of Independence: Needs assistance with ADLs Bath: Moderate (gets winded) Dressing: Moderate (gets winded) Grooming: Minimal (gets winded) Driving: No Vocation: On disability Cognition Cognition Arousal/Alertness: Awake/alert Overall Cognitive Status: Appears within functional limits for tasks assessed Orientation Level:  Oriented X4 Sensation/Coordination Sensation Light Touch: Appears Intact Stereognosis: Not tested Hot/Cold: Not tested Proprioception: Not tested Coordination Gross  Motor Movements are Fluid and Coordinated: Yes Fine Motor Movements are Fluid and Coordinated: Yes Extremity Assessment RUE Assessment RUE Assessment: Within Functional Limits LUE Assessment LUE Assessment: Within Functional Limits RLE Assessment RLE Assessment: Within Functional Limits LLE Assessment LLE Assessment: Within Functional Limits Mobility (including Balance) Bed Mobility Bed Mobility: Yes Sit to Supine - Right: 4: Min assist;With rail;HOB elevated (comment degrees) (HOB elevated to 30 degrees) Sit to Supine - Right Details (indicate cue type and reason): Needed HOB up as pt dyspneic if bed flat. Transfers Transfers: Yes Sit to Stand: 4: Min assist;With upper extremity assist;From elevated surface;From bed Sit to Stand Details (indicate cue type and reason): Pt becomes dyspneic 3/4 with sit to stand.  Dyspnea at rest is 1-2/4.  Needs cues for pursed lip breathing.   Stand to Sit: 4: Min assist;With upper extremity assist;To bed Stand to Sit Details: Slightly uncontrolled descent into chair.   Stand Pivot Transfers: 4: Min assist Stand Pivot Transfer Details (indicate cue type and reason): cues for sequencing steps.   Ambulation/Gait Ambulation/Gait: No Stairs: No Wheelchair Mobility Wheelchair Mobility: No  Posture/Postural Control Posture/Postural Control: Postural limitations Postural Limitations: Forward flexed posture Balance Balance Assessed: Yes Static Standing Balance Static Standing - Balance Support: No upper extremity supported;During functional activity Static Standing - Level of Assistance: 4: Min assist Static Standing - Comment/# of Minutes: 2 Exercise  General Exercises - Lower Extremity Ankle Circles/Pumps: AROM;Strengthening;Both;5 reps;Supine Quad Sets: AROM;Strengthening;Both;5  reps;Supine Heel Slides: AROM;Strengthening;Both;5 reps;Supine End of Session PT - End of Session Equipment Utilized During Treatment: Gait belt Activity Tolerance: Patient limited by fatigue (and dyspnea) Patient left: in bed;with call bell in reach;with family/visitor present Nurse Communication: Mobility status for transfers General Behavior During Session: [facility] for tasks performed Cognition: [facility] for tasks performed  INGOLD,Micala Saltsman 09/14/2011, 5:04 PM [facility] Acute Rehabilitation [phone] (pager)

## 2011-09-14 NOTE — Progress Notes (Signed)
8 Days Post-Op Procedure(s) (LRB): PERICARDIAL WINDOW (N/A) Subjective: No complaints   Objective: Vital signs in last 24 hours: Temp:  [97.8 F (36.6 C)-99 F (37.2 C)] 98.4 F (36.9 C) (12/15 0712) Pulse Rate:  [95-101] 100  (12/15 0712) Cardiac Rhythm:  [-] Sinus tachycardia (12/14 1930) Resp:  [19-20] 19  (12/15 0712) BP: (104-120)/(63-71) 120/71 mmHg (12/15 0712) SpO2:  [95 %-98 %] 95 % (12/15 0712)  Hemodynamic parameters for last 24 hours:    Intake/Output from previous day: 12/14 0701 - 12/15 0700 In: 240 [P.O.:240] Out: 1000 [Urine:1000] Intake/Output this shift: Total I/O In: 240 [P.O.:240] Out: 100 [Urine:100]  General appearance: alert and cooperative Heart: regular rate and rhythm Lungs: clear to auscultation bilaterally Wound: incision healing well. Chest tube suture present  Lab Results:  Basename 09/13/11 1015 09/13/11 0600  WBC 12.2* 12.9*  HGB 8.7* 9.1*  HCT 29.7* 31.6*  PLT 277 285   BMET:  Basename 09/13/11 1015 09/13/11 0600  NA 138 141  K 4.1 4.0  CL 93* 94*  CO2 39* 41*  GLUCOSE 101* 91  BUN 5* 4*  CREATININE 0.39* 0.45*  CALCIUM 9.4 9.6    PT/INR: No results found for this basename: LABPROT,INR in the last 72 hours ABG No results found for this basename: phart, pco2, po2, hco3, tco2, acidbasedef, o2sat   CBG (last 3)   Basename 09/12/11 1640 09/12/11 1156 09/12/11 0759  GLUCAP 119* 151* 113*    Assessment/Plan: S/P Procedure(s) (LRB): PERICARDIAL WINDOW (N/A) Continue mobilization as tolerated.  Keep chest tube suture in for about 7 days.   LOS: 8 days    BARTLE,BRYAN K 09/14/2011

## 2011-09-14 NOTE — Plan of Care (Signed)
Problem: Phase II Progression Outcomes Goal: Progress activity as tolerated unless otherwise ordered Outcome: Progressing PT evaluation completed. Pt. Should progress to home with HHPT f/u.  Has 24 hour care.  May need RW and 3N1.  Thanks.  Continue PT.   [facility] Acute Rehabilitation [phone] (pager)

## 2011-09-14 NOTE — Progress Notes (Signed)
Kristen Keller  HQI:[id number]  DOB: Feb 23, 1951  DOA: 09/06/2011  Subjective: No specific complaints, does not like hospital food, wants a Xanax to stay calm and does not want any tests over the weekend.  Objective: Weight change:   Intake/Output Summary (Last 24 hours) at 09/14/11 0841 Last data filed at 09/14/11 0806  Gross per 24 hour  Intake    480 ml  Output    750 ml  Net   -270 ml   Blood pressure 120/71, pulse 100, temperature 98.4 F (36.9 C), temperature source Oral, resp. rate 19, height 4\' 11"  (1.499 m), weight 47.31 kg (104 lb 4.8 oz), SpO2 95.00%.  Physical Exam: General: Alert and awake, oriented x3, not in any acute distress. HEENT: anicteric sclera, pupils reactive to light and accommodation, EOMI CVS: S1-S2 clear, no murmur rubs or gallops, irregular Chest: Decreased breath sounds, minimal wheezing Abdomen: soft nontender, nondistended, normal bowel sounds, no organomegaly Extremities: no cyanosis, clubbing or edema noted bilaterally Neuro: Cranial nerves II-XII intact, no focal neurological deficits  Lab Results: Basic Metabolic Panel:  Lab 09/13/11 1324 09/13/11 0600  NA 138 141  K 4.1 4.0  CL 93* 94*  CO2 39* 41*  GLUCOSE 101* 91  BUN 5* 4*  CREATININE 0.39* 0.45*  CALCIUM 9.4 9.6  MG -- --  PHOS -- --   Liver Function Tests:  Lab 09/13/11 0600  AST 17  ALT 115*  ALKPHOS 68  BILITOT 0.3  PROT 7.1  ALBUMIN 2.4*   CBC:  Lab 09/13/11 1015 09/13/11 0600  WBC 12.2* 12.9*  NEUTROABS -- --  HGB 8.7* 9.1*  HCT 29.7* 31.6*  MCV 90.8 90.5  PLT 277 285   CBG:  Lab 09/12/11 1640 09/12/11 1156 09/12/11 0759 09/11/11 2136 09/11/11 1254  GLUCAP 119* 151* 113* 153* 111*     Micro Results: Recent Results (from the past 240 hour(s))  MRSA PCR SCREENING     Status: Normal   Collection Time   09/06/11 11:51 AM      Component Value Range Status Comment   MRSA by PCR NEGATIVE  NEGATIVE  Final     Studies/Results: Ct Abdomen Pelvis W  Contrast  09/13/2011  *RADIOLOGY REPORT*  Clinical Data: Lung mass.  Pericardial effusion.  CT ABDOMEN AND PELVIS WITH CONTRAST  Technique:  Multidetector CT imaging of the abdomen and pelvis was performed following the standard protocol during bolus administration of intravenous contrast.  Contrast:  90 ml Omnipaque-300  Comparison: Chest x-ray dated 09/11/2011  Findings: The patient has a large left pleural effusion and a moderate right effusion with secondary compressive atelectasis of the lower lobes.  Slight thickening of the pericardium.  Recent pericardial window.  Heart size is normal.  The liver, spleen, pancreas, adrenal glands, and kidneys demonstrate no significant abnormalities.  Small benign appearing renal cysts bilaterally.  Prior cholecystectomy with secondary slight dilatation of the common hepatic and common bile duct. Two tiny nonenhancing low density lesions in the right lobe of the liver, most likely tiny cysts.  The bowel appears normal except for scattered diverticula in the distal colon.  Small amount of nonspecific free fluid in the pelvis.  Uterus has been removed.  Right ovary is normal.  The left ovary is normal.  No significant osseous abnormality.  IMPRESSION:  1.  No significant abnormality of the abdomen. No evidence of primary or metastatic tumor in the abdomen or pelvis.  2.  Large left effusion.  Moderate right effusion.  Original Report  Authenticated By: Gwynn Burly, M.D.   Dg Chest Port 1 View  09/14/2011  *RADIOLOGY REPORT*  Clinical Data: Follow up pleural effusion.  PORTABLE CHEST - 1 VIEW  Comparison: Multiple prior chest x-rays.  Findings: The cardiac silhouette, mediastinal and hilar contours are stable.  Persistent right upper lobe lung mass.  The pleural effusions are slightly improved.  Persistent bibasilar infiltrates or atelectasis.  IMPRESSION:  1.  Persistent right upper lobe lung mass. 2.  Slight improved small bilateral pleural effusions and bibasilar  infiltrates or atelectasis.  Original Report Authenticated By: P. Loralie Champagne, M.D.   Dg Chest Port 1 View  09/11/2011  *RADIOLOGY REPORT*  Clinical Data: Short of breath  PORTABLE CHEST - 1 VIEW  Comparison: 09/09/2011  Findings: Improvement in bilateral airspace disease which may be due to resolving pulmonary edema.  There remains bibasilar airspace disease and pneumonia is not excluded.  There are small effusions bilaterally.  Right upper lobe density is unchanged from the  prior study and may represent carcinoma.  Chest CT is suggested.  IMPRESSION: Improving bilateral airspace disease suggesting improving edema. There remains atelectasis and effusions bilaterally, left greater than right.  Right upper lobe mass lesion again present.  CT chest suggested for further evaluation.  Original Report Authenticated By: Camelia Phenes, M.D.   Dg Chest Port 1 View  09/09/2011  *RADIOLOGY REPORT*  Clinical Data: Shortness of breath  PORTABLE CHEST - 1 VIEW  Comparison: 09/08/2011  Findings: Left greater than right bilateral lower lobe airspace disease noted.  Small bilateral effusions persist.  A few Kerley B lines are noted.  Right suprahilar mass is stable.  Mild enlargement of the cardiomediastinal silhouette noted without overt edema.  IMPRESSION: Stable left greater than right bilateral lower lobe airspace opacities which could represent asymmetric edema in the setting of mild interstitial edema or pneumonia.  Right suprahilar mass again noted, for which further evaluation with chest CT with IV contrast was previously recommended if not already performed at an outside institution.  Original Report Authenticated By: Harrel Lemon, M.D.   Dg Chest Port 1 View  09/08/2011  *RADIOLOGY REPORT*  Clinical Data: Productive cough.  Fever.  Pericardial effusion. Lung mass.  PORTABLE CHEST - 1 VIEW  Comparison: 09/07/2011  Findings: Endotracheal tube has been removed.  Pericardial drain remains in place and  heart size remains within normal limits.  Increased bibasilar pulmonary opacity is seen likely representing combination of pleural effusion and atelectasis.  Mass in the medial right upper lobe is stable.  IMPRESSION:  1.  Increased bibasilar opacity, likely due to combination of pleural effusions and atelectasis. 2.  Stable mass in the medial right upper lobe.  Original Report Authenticated By: Danae Orleans, M.D.   Dg Chest Port 1 View  09/07/2011  *RADIOLOGY REPORT*  Clinical Data: Pericardial effusion.  Status post pericardial window. Lung carcinoma.  PORTABLE CHEST - 1 VIEW  Comparison: 09/07/2011  Findings: Pericardial drain remains in place and heart size remains within normal limits.  Endotracheal tube remains in appropriate position.  Probable tiny left pleural effusion and mild left basilar atelectasis again noted.  5 cm mass in the medial right upper lobe is stable.  IMPRESSION:  1.  Normal heart size with pericardial drain in place. 2.  Stable probable tiny left pleural effusion and left basilar atelectasis.  Three stable right upper lobe mass.  Original Report Authenticated By: Danae Orleans, M.D.   Dg Chest Port 1 View  09/07/2011  *  RADIOLOGY REPORT*  Clinical Data: Window.  PORTABLE CHEST - 1 VIEW  Comparison: None.  Findings: Endotracheal tube is present with the tip 31 mm from the carina.  Right paratracheal soft tissue density mass is present, possibly related to the right hilum.  This measures 51 mm x 38 mm. Appearance is suspicious for neoplasm.  Follow-up chest CT should be considered if not already performed.  Pericardial drain is present.  Cardiopericardial silhouette appears within normal limits for projection.  There is no pneumothorax identified.  IMPRESSION:  1.  Endotracheal tube 31 mm from the carina. 2.  Pericardial drain is present with normal size of the cardiopericardial silhouette. 3.  Right suprahilar / paratracheal mass suspicious for neoplasm. Follow-up chest CT, preferably  with infusion, should be considered if this is a new finding.  Original Report Authenticated By: Andreas Newport, M.D.    Medications: Scheduled Meds:   . ALPRAZolam  1 mg Oral TID  . antiseptic oral rinse  15 mL Mouth Rinse BID  . digoxin  0.25 mg Oral Daily  . enoxaparin (LOVENOX) injection  45 mg Subcutaneous Q12H  . feeding supplement  1 Container Oral BID  . Fluticasone-Salmeterol  1 puff Inhalation BID  . levofloxacin  500 mg Oral Daily  . loratadine  10 mg Oral Daily  . metoCLOPramide  5 mg Oral TID AC & HS  . montelukast  10 mg Oral QHS  . pantoprazole  40 mg Oral Daily  . potassium chloride  20 mEq Oral Daily  . sodium chloride  3 mL Intravenous Q12H  . tiotropium  18 mcg Inhalation Daily  . venlafaxine  75 mg Oral Q breakfast   Continuous Infusions:   . DISCONTD: sodium chloride 10 mL/hr at 09/12/11 2016  . DISCONTD: heparin 1,400 Units/hr (09/13/11 0401)     Assessment/Plan: Principal Problem:  *Pericardial effusion, acute: - Status post pericardial window on 09/06/2011, post op care per [facility]  Active Problems:  Pulmonary embolism, DVT (deep venous thrombosis): Continue full dose Lovenox   Pleural effusion, malignant: Unclear if patient had thoracentesis done in conjunction with the pericardial window, but CT abdomen pelvis done yesterday shows large left pleural effusion and moderate right effusion. 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 Patient refuses to have any tests done over the weekend. Will arrange for ultrasound guided thoracentesis on Monday.  Afib, hypertension: Rate controlled, on full dose Lovenox, and cardiology following.   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 Reviewed oncology note by Dr.  Arline Asp, recommend complete staging with CT scans, brain MRI, bone scan, CEA. Patient requests no tests over the weekend. She will need to have a followup plan as she lives in [place], [place].  Anxiety state: Continue Xanax    LOS: 8 days   RAI,RIPUDEEP 09/14/2011, 8:41 AM

## 2011-09-14 NOTE — Progress Notes (Signed)
   SUBJECTIVE:  She says that she is feeling stronger   PHYSICAL EXAM Filed Vitals:   09/13/11 1425 09/13/11 1953 09/13/11 2100 09/14/11 0712  BP: 104/65  111/63 120/71  Pulse: 95  101 100  Temp: 97.8 F (36.6 C)  99 F (37.2 C) 98.4 F (36.9 C)  TempSrc: Oral  Oral Oral  Resp: 20  19 19   Height:      Weight:      SpO2: 95% 95% 98% 95%   General:  No acute distress Lungs:  Decreased BS Heart:  RRR, no rub Abdomen:  Positive bowel sounds, no rebound no guarding Extremities:  No edema.  LABS: No results found for this basename: CKTOTAL, CKMB, CKMBINDEX, TROPONINI   Results for orders placed during the hospital encounter of 09/06/11 (from the past 24 hour(s))  BASIC METABOLIC PANEL     Status: Abnormal   Collection Time   09/13/11 10:15 AM      Component Value Range   Sodium 138  135 - 145 (mEq/L)   Potassium 4.1  3.5 - 5.1 (mEq/L)   Chloride 93 (*) 96 - 112 (mEq/L)   CO2 39 (*) 19 - 32 (mEq/L)   Glucose, Bld 101 (*) 70 - 99 (mg/dL)   BUN 5 (*) 6 - 23 (mg/dL)   Creatinine, Ser 7.82 (*) 0.50 - 1.10 (mg/dL)   Calcium 9.4  8.4 - 95.6 (mg/dL)   GFR calc non Af Amer >90  >90 (mL/min)   GFR calc Af Amer >90  >90 (mL/min)  CBC     Status: Abnormal   Collection Time   09/13/11 10:15 AM      Component Value Range   WBC 12.2 (*) 4.0 - 10.5 (K/uL)   RBC 3.27 (*) 3.87 - 5.11 (MIL/uL)   Hemoglobin 8.7 (*) 12.0 - 15.0 (g/dL)   HCT 21.3 (*) 08.6 - 46.0 (%)   MCV 90.8  78.0 - 100.0 (fL)   MCH 26.6  26.0 - 34.0 (pg)   MCHC 29.3 (*) 30.0 - 36.0 (g/dL)   RDW 57.8  46.9 - 62.9 (%)   Platelets 277  150 - 400 (K/uL)    Intake/Output Summary (Last 24 hours) at 09/14/11 0942 Last data filed at 09/14/11 5284  Gross per 24 hour  Intake    360 ml  Output    400 ml  Net    -40 ml    ASSESSMENT AND PLAN:  1)  Pericardial effusion:  No significant recurrence via echo.    2)  Atrial fibrillation:  Maintaining NSR.  We will follow as needed.  Fayrene Fearing [facility] 09/14/2011 9:42  AM

## 2011-09-15 LAB — GLUCOSE, CAPILLARY: Glucose-Capillary: 81 mg/dL (ref 70–99)

## 2011-09-15 NOTE — Progress Notes (Signed)
Kristen Keller  ZOX:[id number]  DOB: 04-20-1951  DOA: 09/06/2011  Subjective: No specific complaints, family member at bedside   Objective: Weight change:   Intake/Output Summary (Last 24 hours) at 09/15/11 8119 Last data filed at 09/15/11 0600  Gross per 24 hour  Intake    240 ml  Output   1450 ml  Net  -1210 ml   Blood pressure 106/57, pulse 109, temperature 98.5 F (36.9 C), temperature source Oral, resp. rate 18, height 4\' 11"  (1.499 m), weight 43.636 kg (96 lb 3.2 oz), SpO2 96.00%.  Physical Exam: General: Alert and awake, oriented x3, not in any acute distress. HEENT: anicteric sclera, pupils reactive to light and accommodation, EOMI CVS: S1-S2 clear, no murmur rubs or gallops, irregular Chest: Decreased breath sounds, minimal wheezing Abdomen: soft nontender, nondistended, normal bowel sounds, no organomegaly Extremities: no cyanosis, clubbing or edema noted bilaterally Neuro: Cranial nerves II-XII intact, no focal neurological deficits  Lab Results: Basic Metabolic Panel:  Lab 09/13/11 1478 09/13/11 0600  NA 138 141  K 4.1 4.0  CL 93* 94*  CO2 39* 41*  GLUCOSE 101* 91  BUN 5* 4*  CREATININE 0.39* 0.45*  CALCIUM 9.4 9.6  MG -- --  PHOS -- --   Liver Function Tests:  Lab 09/13/11 0600  AST 17  ALT 115*  ALKPHOS 68  BILITOT 0.3  PROT 7.1  ALBUMIN 2.4*   CBC:  Lab 09/13/11 1015 09/13/11 0600  WBC 12.2* 12.9*  NEUTROABS -- --  HGB 8.7* 9.1*  HCT 29.7* 31.6*  MCV 90.8 90.5  PLT 277 285   CBG:  Lab 09/15/11 0609 09/12/11 1640 09/12/11 1156 09/12/11 0759 09/11/11 2136  GLUCAP 81 119* 151* 113* 153*     Micro Results: Recent Results (from the past 240 hour(s))  MRSA PCR SCREENING     Status: Normal   Collection Time   09/06/11 11:51 AM      Component Value Range Status Comment   MRSA by PCR NEGATIVE  NEGATIVE  Final     Studies/Results: Ct Abdomen Pelvis W Contrast  09/13/2011  *RADIOLOGY REPORT*  Clinical Data: Lung mass.  Pericardial  effusion.  CT ABDOMEN AND PELVIS WITH CONTRAST  Technique:  Multidetector CT imaging of the abdomen and pelvis was performed following the standard protocol during bolus administration of intravenous contrast.  Contrast:  90 ml Omnipaque-300  Comparison: Chest x-ray dated 09/11/2011  Findings: The patient has a large left pleural effusion and a moderate right effusion with secondary compressive atelectasis of the lower lobes.  Slight thickening of the pericardium.  Recent pericardial window.  Heart size is normal.  The liver, spleen, pancreas, adrenal glands, and kidneys demonstrate no significant abnormalities.  Small benign appearing renal cysts bilaterally.  Prior cholecystectomy with secondary slight dilatation of the common hepatic and common bile duct. Two tiny nonenhancing low density lesions in the right lobe of the liver, most likely tiny cysts.  The bowel appears normal except for scattered diverticula in the distal colon.  Small amount of nonspecific free fluid in the pelvis.  Uterus has been removed.  Right ovary is normal.  The left ovary is normal.  No significant osseous abnormality.  IMPRESSION:  1.  No significant abnormality of the abdomen. No evidence of primary or metastatic tumor in the abdomen or pelvis.  2.  Large left effusion.  Moderate right effusion.  Original Report Authenticated By: Gwynn Burly, M.D.   Dg Chest Port 1 View  09/14/2011  *RADIOLOGY  REPORT*  Clinical Data: Follow up pleural effusion.  PORTABLE CHEST - 1 VIEW  Comparison: Multiple prior chest x-rays.  Findings: The cardiac silhouette, mediastinal and hilar contours are stable.  Persistent right upper lobe lung mass.  The pleural effusions are slightly improved.  Persistent bibasilar infiltrates or atelectasis.  IMPRESSION:  1.  Persistent right upper lobe lung mass. 2.  Slight improved small bilateral pleural effusions and bibasilar infiltrates or atelectasis.  Original Report Authenticated By: P. Loralie Champagne,  M.D.   Dg Chest Port 1 View  09/11/2011  *RADIOLOGY REPORT*  Clinical Data: Short of breath  PORTABLE CHEST - 1 VIEW  Comparison: 09/09/2011  Findings: Improvement in bilateral airspace disease which may be due to resolving pulmonary edema.  There remains bibasilar airspace disease and pneumonia is not excluded.  There are small effusions bilaterally.  Right upper lobe density is unchanged from the  prior study and may represent carcinoma.  Chest CT is suggested.  IMPRESSION: Improving bilateral airspace disease suggesting improving edema. There remains atelectasis and effusions bilaterally, left greater than right.  Right upper lobe mass lesion again present.  CT chest suggested for further evaluation.  Original Report Authenticated By: Camelia Phenes, M.D.   Dg Chest Port 1 View  09/09/2011  *RADIOLOGY REPORT*  Clinical Data: Shortness of breath  PORTABLE CHEST - 1 VIEW  Comparison: 09/08/2011  Findings: Left greater than right bilateral lower lobe airspace disease noted.  Small bilateral effusions persist.  A few Kerley B lines are noted.  Right suprahilar mass is stable.  Mild enlargement of the cardiomediastinal silhouette noted without overt edema.  IMPRESSION: Stable left greater than right bilateral lower lobe airspace opacities which could represent asymmetric edema in the setting of mild interstitial edema or pneumonia.  Right suprahilar mass again noted, for which further evaluation with chest CT with IV contrast was previously recommended if not already performed at an outside institution.  Original Report Authenticated By: Harrel Lemon, M.D.   Dg Chest Port 1 View  09/08/2011  *RADIOLOGY REPORT*  Clinical Data: Productive cough.  Fever.  Pericardial effusion. Lung mass.  PORTABLE CHEST - 1 VIEW  Comparison: 09/07/2011  Findings: Endotracheal tube has been removed.  Pericardial drain remains in place and heart size remains within normal limits.  Increased bibasilar pulmonary opacity is  seen likely representing combination of pleural effusion and atelectasis.  Mass in the medial right upper lobe is stable.  IMPRESSION:  1.  Increased bibasilar opacity, likely due to combination of pleural effusions and atelectasis. 2.  Stable mass in the medial right upper lobe.  Original Report Authenticated By: Danae Orleans, M.D.   Dg Chest Port 1 View  09/07/2011  *RADIOLOGY REPORT*  Clinical Data: Pericardial effusion.  Status post pericardial window. Lung carcinoma.  PORTABLE CHEST - 1 VIEW  Comparison: 09/07/2011  Findings: Pericardial drain remains in place and heart size remains within normal limits.  Endotracheal tube remains in appropriate position.  Probable tiny left pleural effusion and mild left basilar atelectasis again noted.  5 cm mass in the medial right upper lobe is stable.  IMPRESSION:  1.  Normal heart size with pericardial drain in place. 2.  Stable probable tiny left pleural effusion and left basilar atelectasis.  Three stable right upper lobe mass.  Original Report Authenticated By: Danae Orleans, M.D.   Dg Chest Port 1 View  09/07/2011  *RADIOLOGY REPORT*  Clinical Data: Window.  PORTABLE CHEST - 1 VIEW  Comparison: None.  Findings: Endotracheal tube is present with the tip 31 mm from the carina.  Right paratracheal soft tissue density mass is present, possibly related to the right hilum.  This measures 51 mm x 38 mm. Appearance is suspicious for neoplasm.  Follow-up chest CT should be considered if not already performed.  Pericardial drain is present.  Cardiopericardial silhouette appears within normal limits for projection.  There is no pneumothorax identified.  IMPRESSION:  1.  Endotracheal tube 31 mm from the carina. 2.  Pericardial drain is present with normal size of the cardiopericardial silhouette. 3.  Right suprahilar / paratracheal mass suspicious for neoplasm. Follow-up chest CT, preferably with infusion, should be considered if this is a new finding.  Original Report  Authenticated By: Andreas Newport, M.D.    Medications: Scheduled Meds:    . albuterol  2.5 mg Nebulization BID  . ALPRAZolam  1 mg Oral TID  . antiseptic oral rinse  15 mL Mouth Rinse BID  . digoxin  0.25 mg Oral Daily  . enoxaparin (LOVENOX) injection  45 mg Subcutaneous Q12H  . feeding supplement  1 Container Oral BID  . Fluticasone-Salmeterol  1 puff Inhalation BID  . levofloxacin  500 mg Oral Daily  . loratadine  10 mg Oral Daily  . metoCLOPramide  5 mg Oral TID AC & HS  . montelukast  10 mg Oral QHS  . pantoprazole  40 mg Oral Daily  . potassium chloride  20 mEq Oral Daily  . sodium chloride  3 mL Intravenous Q12H  . tiotropium  18 mcg Inhalation Daily  . venlafaxine  75 mg Oral Q breakfast   Continuous Infusions:    Assessment/Plan: Principal Problem:  *Pericardial effusion, acute: - Status post pericardial window on 09/06/2011, post op care per [facility]  Active Problems:  Pulmonary embolism, DVT (deep venous thrombosis): Continue full dose Lovenox   Pleural effusion, malignant: Unclear if patient had thoracentesis done in conjunction with the pericardial window, but CT abdomen pelvis done yesterday shows large left pleural effusion and moderate right effusion. 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 Patient refuses to have any tests done over the weekend. Will arrange for ultrasound guided thoracentesis on Monday. - Discussed with TCTS PA for recommendations today, will await their final recommendations  Afib, hypertension: Rate controlled, on full dose Lovenox, and cardiology following.   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 Reviewed oncology note by Dr. Arline Asp, recommend complete staging with CT scans, brain MRI, bone  scan, CEA. Patient requests no tests over the weekend. She will need to have a followup plan as she lives in [place], [place]. - Brain MRI, Nuclear medicine bone scan pending likely for tomorrow  Anxiety state: Continue Xanax    LOS: 9 days   RAI,RIPUDEEP 09/15/2011, 9:37 AM

## 2011-09-15 NOTE — Progress Notes (Signed)
9 Days Post-Op Procedure(s) (LRB): PERICARDIAL WINDOW (N/A) Subjective: No complaints. Lying in bed.  Not much activity so far.  Objective: Vital signs in last 24 hours: Temp:  [98.1 F (36.7 C)-98.6 F (37 C)] 98.1 F (36.7 C) (12/16 1413) Pulse Rate:  [100-114] 100  (12/16 1413) Cardiac Rhythm:  [-] Sinus tachycardia (12/16 0810) Resp:  [18-20] 18  (12/16 1413) BP: (105-133)/(56-70) 105/56 mmHg (12/16 1413) SpO2:  [92 %-96 %] 96 % (12/16 1413) Weight:  [43.636 kg (96 lb 3.2 oz)] 96 lb 3.2 oz (43.636 kg) (12/16 0621)  Hemodynamic parameters for last 24 hours:    Intake/Output from previous day: 12/15 0701 - 12/16 0700 In: 480 [P.O.:480] Out: 1450 [Urine:1450] Intake/Output this shift:    General appearance: alert and cooperative Heart: regular rate and rhythm, S1, S2 normal, no murmur, click, rub or gallop Lungs: diminished breath sounds LLL and RLL Extremities: extremities normal, atraumatic, no cyanosis or edema Wound: Incision healing well.  Lab Results:  Basename 09/13/11 1015 09/13/11 0600  WBC 12.2* 12.9*  HGB 8.7* 9.1*  HCT 29.7* 31.6*  PLT 277 285   BMET:  Basename 09/13/11 1015 09/13/11 0600  NA 138 141  K 4.1 4.0  CL 93* 94*  CO2 39* 41*  GLUCOSE 101* 91  BUN 5* 4*  CREATININE 0.39* 0.45*  CALCIUM 9.4 9.6    PT/INR: No results found for this basename: LABPROT,INR in the last 72 hours ABG No results found for this basename: phart, pco2, po2, hco3, tco2, acidbasedef, o2sat   CBG (last 3)   Basename 09/15/11 0609 09/12/11 1640  GLUCAP 81 119*    *RADIOLOGY REPORT*  Clinical Data: Lung mass. Pericardial effusion.  CT ABDOMEN AND PELVIS WITH CONTRAST  Technique: Multidetector CT imaging of the abdomen and pelvis was  performed following the standard protocol during bolus  administration of intravenous contrast.  Contrast: 90 ml Omnipaque-300  Comparison: Chest x-ray dated 09/11/2011  Findings: The patient has a large left pleural effusion  and a  moderate right effusion with secondary compressive atelectasis of  the lower lobes.  Slight thickening of the pericardium. Recent pericardial window.  Heart size is normal.  The liver, spleen, pancreas, adrenal glands, and kidneys  demonstrate no significant abnormalities. Small benign appearing  renal cysts bilaterally. Prior cholecystectomy with secondary  slight dilatation of the common hepatic and common bile duct.  Two tiny nonenhancing low density lesions in the right lobe of the  liver, most likely tiny cysts.  The bowel appears normal except for scattered diverticula in the  distal colon. Small amount of nonspecific free fluid in the  pelvis. Uterus has been removed. Right ovary is normal. The left  ovary is normal.  No significant osseous abnormality.  IMPRESSION:  1. No significant abnormality of the abdomen. No evidence of  primary or metastatic tumor in the abdomen or pelvis.  2. Large left effusion. Moderate right effusion.  Original Report Authenticated By: Gwynn Burly, M.D.      Imaging     CT Abdomen Pelvis W Contrast (Order #[id number]) on 09/12/2011 - Imaging Information     Assessment/Plan: S/P Procedure(s) (LRB): PERICARDIAL WINDOW (N/A)  CT scan shows large left pleural effusion and moderate right effusion.  These will require drainage.  Will discuss with Dr. Dorris Fetch so he can decide on the optimal method for drainage.  LOS: 9 days    BARTLE,BRYAN K 09/15/2011  2

## 2011-09-15 NOTE — Progress Notes (Signed)
ANTICOAGULATION CONSULT NOTE - Follow Up Consult  Pharmacy Consult for Lovenox Indication:  Atrial fibrillation. Recent DVT/PE  Allergies  Allergen Reactions  . Codeine     REACTION: "feels like walking outside her body", itching  . Erythromycin     REACTION: hives, sob, diff. breathing  . Zolpidem Tartrate     REACTION: keeps her awake, shaking    Patient Measurements: Height: 4\' 11"  (149.9 cm) Weight: 96 lb 3.2 oz (43.636 kg) IBW/kg (Calculated) : 43.2   Vital Signs: Temp: 98.5 F (36.9 C) (12/16 0609) Temp src: Oral (12/16 0609) BP: 106/57 mmHg (12/16 0609) Pulse Rate: 109  (12/16 0609)  Labs:  Basename 09/13/11 1015 09/13/11 0600  HGB 8.7* 9.1*  HCT 29.7* 31.6*  PLT 277 285  APTT -- --  LABPROT -- --  INR -- --  HEPARINUNFRC -- 0.46  CREATININE 0.39* 0.45*  CKTOTAL -- --  CKMB -- --  TROPONINI -- --   Estimated Creatinine Clearance: 51.6 ml/min (by C-G formula based on Cr of 0.39).   Assessment: 60yo female to change to Lovenox for Afib, with recent DVT/PE, and s/p pericardial window.   Goal of Therapy:     Lovenox 1mg /kg/12h.   Plan:  Continue Lovenox 45 mg sq q12 Follow CBC in AM  Elwin Sleight PharmD 09/15/2011,11:26 AM

## 2011-09-16 ENCOUNTER — Inpatient Hospital Stay (HOSPITAL_COMMUNITY): Payer: Medicare Other

## 2011-09-16 LAB — CBC
MCV: 88.8 fL (ref 78.0–100.0)
Platelets: 469 10*3/uL — ABNORMAL HIGH (ref 150–400)
RDW: 15.5 % (ref 11.5–15.5)
WBC: 16.7 10*3/uL — ABNORMAL HIGH (ref 4.0–10.5)

## 2011-09-16 NOTE — Progress Notes (Signed)
Patient ID: Kristen Keller, female   DOB: [birth year], 60 y.o.   MRN: [id number]  No c/o this AM BP 122/68  Pulse 110  Temp(Src) 98 F (36.7 C) (Oral)  Resp 18  Ht 4\' 11"  (1.499 m)  Wt 42.683 kg (94 lb 1.6 oz)  BMI 19.01 kg/m2  SpO2 94% Wound clean and dry CT site healing well Ct abd shows a moderate Left effusion and a very small R effusion  Bilateral pleural effusions left > right. The left effusion is at most moderate, really not impressive at all on CXR, but some of that may be due to layering since it was a portable film. The right effusion is actually very small and requires no intervention at this time. While these could be malignant, there is an equally good chance they are just related to volume issues / cardiac tamponade. Regardless, the 1st step is to do a thoracentesis.   Would recommend U/S guided Left thoracentesis, R effusion can be followed with a CXR in 3-4 weeks

## 2011-09-16 NOTE — Progress Notes (Signed)
Subjective:  Patient reports no new symptoms over the weekend. Rhythm remains NSR.          Objective:  Vital Signs in the last 24 hours: Temp:  [98 F (36.7 C)-98.3 F (36.8 C)] 98 F (36.7 C) (12/17 0646) Pulse Rate:  [80-110] 110  (12/17 0646) Resp:  [18] 18  (12/17 0646) BP: (105-122)/(56-68) 122/68 mmHg (12/17 0646) SpO2:  [93 %-96 %] 94 % (12/17 0646) Weight:  [94 lb 1.6 oz (42.683 kg)] 94 lb 1.6 oz (42.683 kg) (12/17 0646)  Intake/Output from previous day: 12/16 0701 - 12/17 0700 In: 240 [P.O.:240] Out: 200 [Urine:200] Intake/Output from this shift:       . albuterol  2.5 mg Nebulization BID  . ALPRAZolam  1 mg Oral TID  . antiseptic oral rinse  15 mL Mouth Rinse BID  . digoxin  0.25 mg Oral Daily  . enoxaparin (LOVENOX) injection  45 mg Subcutaneous Q12H  . feeding supplement  1 Container Oral BID  . Fluticasone-Salmeterol  1 puff Inhalation BID  . levofloxacin  500 mg Oral Daily  . loratadine  10 mg Oral Daily  . metoCLOPramide  5 mg Oral TID AC & HS  . montelukast  10 mg Oral QHS  . pantoprazole  40 mg Oral Daily  . potassium chloride  20 mEq Oral Daily  . sodium chloride  3 mL Intravenous Q12H  . tiotropium  18 mcg Inhalation Daily  . venlafaxine  75 mg Oral Q breakfast      Physical Exam: The patient appears to be in no distress.  Head and neck exam reveals that the pupils are equal and reactive.  The extraocular movements are full.  There is no scleral icterus.  Mouth and pharynx are benign.  No lymphadenopathy.  No carotid bruits.  The jugular venous pressure is normal.  Thyroid is not enlarged or tender.  Chest reveals decreased breath souns at both bases.  Heart reveals no abnormal lift or heave.  First and second heart sounds are normal.  There is no murmur gallop rub or click.Sinus tachycardia. The abdomen is soft and nontender.  Bowel sounds are normoactive.  There is no hepatosplenomegaly or mass.  There are no abdominal  bruits.  Extremities reveal no phlebitis or edema.  Pedal pulses are good.  There is no cyanosis or clubbing.  Neurologic exam is normal strength and no lateralizing weakness.  No sensory deficits.  Integument reveals no rash  Lab Results:  Basename 09/16/11 0630 09/13/11 1015  WBC 16.7* 12.2*  HGB 10.3* 8.7*  PLT 469* 277    Basename 09/13/11 1015  NA 138  K 4.1  CL 93*  CO2 39*  GLUCOSE 101*  BUN 5*  CREATININE 0.39*   No results found for this basename: TROPONINI:2,CK,MB:2 in the last 72 hours Hepatic Function Panel No results found for this basename: PROT,ALBUMIN,AST,ALT,ALKPHOS,BILITOT,BILIDIR,IBILI in the last 72 hours No results found for this basename: CHOL in the last 72 hours No results found for this basename: PROTIME in the last 72 hours  Imaging: Imaging results have been reviewed.  Cardiac Studies: Tele shows NSR Assessment/Plan:  Patient Active Problem List  Diagnoses  . HYPERTENSION, UNSPECIFIED  . ATRIAL FIBRILLATION  . COPD  . TOBACCO ABUSE, HX OF  . Encounter for long-term (current) use of anticoagulants  . COPD (chronic obstructive pulmonary disease)  . Hypertension  . Pulmonary embolism  . DVT (deep venous thrombosis)  . Pneumonia  . Pulmonary fibrosis  . Pericardial  effusion, acute  . Pleural effusion, malignant  . Anemia  . Hypotension  . Ejection fraction  . Lung mass        Continue present cardiac meds.        Dr. Dorris Fetch to decide about optimal pleural drainage.  LOS: 10 days    Kristen Keller 09/16/2011, 8:42 AM

## 2011-09-16 NOTE — Progress Notes (Signed)
Subjective: Events since 12/13 noted. LAst CXR 12/14 shows lg reaccumulation of pleural fluid bilaterally, L>R. For thoracentesis prior to  proceeding with planned MRI brain and bone scan. Stable overnight.   Objective: Vital signs in last 24 hours: BP 122/68  Pulse 110  Temp(Src) 98 F (36.7 C) (Oral)  Resp 18  Ht 4\' 11"  (1.499 m)  Wt 94 lb 1.6 oz (42.683 kg)  BMI 19.01 kg/m2  SpO2 94% @WEIGHT @  Physical Exam: 60 y.o.  in no acute distress  A. and O. X3. Flat affect. HEENT: Sclera anicteric. Oral cavity without thrush or lesions. Neck supple.no cervical or supraclavicular adenopathy  Lungs:decreased breath sounds on L >R. No wheezing, rhonchi or rales. CV regular rate and rhythm normal S1-S2, no murmur , rubs or gallops Abdomen soft nontender , bowel sounds x4  no hepatosplenomegaly GU/rectal: deferred. Extremities: no clubbing cyanosis . No edema. Bruising at L venipuncture site. No petechial rash Neurologic: non focal    Lab Results: Labs:  CBC   Lab 09/16/11 0630 09/13/11 1015 09/13/11 0600 09/12/11 0510 09/11/11 0308  WBC 16.7* 12.2* 12.9* 13.7* 13.8*  HGB 10.3* 8.7* 9.1* 8.4* 8.6*  HCT 34.9* 29.7* 31.6* 29.3* 29.5*  PLT 469* 277 285 283 271  MCV 88.8 90.8 90.5 90.7 91.0  MCH 26.2 26.6 26.1 26.0 26.5  MCHC 29.5* 29.3* 28.8* 28.7* 29.2*  RDW 15.5 15.4 15.4 15.6* 15.7*  LYMPHSABS -- -- -- -- --  MONOABS -- -- -- -- --  EOSABS -- -- -- -- --  BASOSABS -- -- -- -- --  BANDABS -- -- -- -- --    CMP    Lab 09/13/11 1015 09/13/11 0600 09/10/11 0535  NA 138 141 143  K 4.1 4.0 3.7  CL 93* 94* 95*  CO2 39* 41* 41*  GLUCOSE 101* 91 97  BUN 5* 4* 3*  CREATININE 0.39* 0.45* 0.35*  CALCIUM 9.4 9.6 8.8  MG -- -- --  AST -- 17 --  ALT -- 115* --  ALKPHOS -- 68 --  BILITOT -- 0.3 --        Component Value Date/Time   BILITOT 0.3 09/13/2011 0600   No CEA was performed over weekend.    Imaging Studies: No results  found.    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 Pt and family were told that the tumor is inoperable and incurable. The patient lives in [place], [place] and is almost always with an Geophysicist/field seismologist.tumor is being tested for EGFR and the ALK translocation. In general, outlook here is not very favorable. Pt has a limited code status presently. Any treatment she might receive would be administered at the [facility], about 20 minutes away. Under ideal circumstances, with a pt with a good performance status, we would treat locally advanced lung cancer with combined radiation and chemotherapy. Mrs. Cryan, at this point, does not appear to be strong enough to undergo these treatments. Hopefully her condition will improve in the days ahead. Pt had requested to hold all testing through the weekend. To resume today. Will await for CEA, LDH.  Pt requests to hold MRI brain and Bone scan,  Or  other CTs till thoracentesis is performed.   Feel free to contact Dr. Arline Asp on pager [phone] if any questions or concerns.   2.Pericardial effusion, acute: for thoracentesis possibly today, after holding Lovenox.   3.Pulmonary embolism/ DVT (deep venous thrombosis) Lovenox   Other medical problems as per respective care teams    LOS: 10 days   [facility] E 09/16/2011, 10:31 AM   Pt seen and examined.  She is now on 2000.  She remains essentially bed-confined, gets up to use the potty chair but little else.  Has SOB with lying flat.  Is scheduled to have therapeutic left thoracentesis tomorrow.  Brain MRI and bone scan are  still pending.  CT scans of abdomen and pelvis did not show any extra-thoracic tumor.  CEA and LDH were normal.  Recommend vigorous attempts to mobilize patient as tolerated. Plan is still to refer her for treatment at [facility]. Call if any questions.  Samul Dada, MD 09/16/11

## 2011-09-16 NOTE — Progress Notes (Signed)
ANTICOAGULATION CONSULT NOTE - Follow Up Consult  Pharmacy Consult for Lovenox Indication: hx afib, DVT/PE  Allergies  Allergen Reactions  . Codeine     REACTION: "feels like walking outside her body", itching  . Erythromycin     REACTION: hives, sob, diff. breathing  . Zolpidem Tartrate     REACTION: keeps her awake, shaking    Patient Measurements: Height: 4\' 11"  (149.9 cm) Weight: 94 lb 1.6 oz (42.683 kg) IBW/kg (Calculated) : 43.2  Adjusted Body Weight:   Vital Signs: Temp: 98 F (36.7 C) (12/17 0646) Temp src: Oral (12/17 0646) BP: 122/68 mmHg (12/17 0646) Pulse Rate: 110  (12/17 0646)  Labs:  Basename 09/16/11 0630 09/13/11 1015  HGB 10.3* 8.7*  HCT 34.9* 29.7*  PLT 469* 277  APTT -- --  LABPROT -- --  INR -- --  HEPARINUNFRC -- --  CREATININE -- 0.39*  CKTOTAL -- --  CKMB -- --  TROPONINI -- --   Estimated Creatinine Clearance: 51 ml/min (by C-G formula based on Cr of 0.39).    Assessment: Patient is a 60 y.o. F on full dose Lovenox for Afib and hx DVT/PE.  Now with bilateral pleural effusions with plan for possible thoracentesis.    Plan:  1) no change for Lovenox 2) Please advise when you want [place] to hold Lovenox if to pursue with thoracentesis  Shereka Lafortune P 09/16/2011,10:11 AM

## 2011-09-16 NOTE — Progress Notes (Signed)
Kristen Keller  ZOX:[id number]  DOB: 07-Feb-1951  DOA: 09/06/2011  Subjective: No specific complaints, family member at bedside. Wants the pleural fluid removed before attempting the bone scan or the MRI.  Objective: Weight change: -0.953 kg (-2 lb 1.6 oz)  Intake/Output Summary (Last 24 hours) at 09/16/11 0910 Last data filed at 09/16/11 0500  Gross per 24 hour  Intake    240 ml  Output    200 ml  Net     40 ml   Blood pressure 122/68, pulse 110, temperature 98 F (36.7 C), temperature source Oral, resp. rate 18, height 4\' 11"  (1.499 m), weight 42.683 kg (94 lb 1.6 oz), SpO2 94.00%.  Physical Exam: General: Alert and awake, oriented x3, not in any acute distress. HEENT: anicteric sclera, pupils reactive to light and accommodation, EOMI CVS: S1-S2 clear, no murmur rubs or gallops, irregular Chest: Decreased breath sounds, minimal wheezing Abdomen: soft nontender, nondistended, normal bowel sounds, no organomegaly Extremities: no cyanosis, clubbing or edema noted bilaterally Neuro: nonfocal  Lab Results: Basic Metabolic Panel:  Lab 09/13/11 8119 09/13/11 0600  NA 138 141  K 4.1 4.0  CL 93* 94*  CO2 39* 41*  GLUCOSE 101* 91  BUN 5* 4*  CREATININE 0.39* 0.45*  CALCIUM 9.4 9.6  MG -- --  PHOS -- --   Liver Function Tests:  Lab 09/13/11 0600  AST 17  ALT 115*  ALKPHOS 68  BILITOT 0.3  PROT 7.1  ALBUMIN 2.4*   CBC:  Lab 09/16/11 0630 09/13/11 1015  WBC 16.7* 12.2*  NEUTROABS -- --  HGB 10.3* 8.7*  HCT 34.9* 29.7*  MCV 88.8 90.8  PLT 469* 277   CBG:  Lab 09/15/11 0609 09/12/11 1640 09/12/11 1156 09/12/11 0759 09/11/11 2136  GLUCAP 81 119* 151* 113* 153*     Micro Results: Recent Results (from the past 240 hour(s))  MRSA PCR SCREENING     Status: Normal   Collection Time   09/06/11 11:51 AM      Component Value Range Status Comment   MRSA by PCR NEGATIVE  NEGATIVE  Final     Studies/Results: Ct Abdomen Pelvis W Contrast  09/13/2011  *RADIOLOGY  REPORT*  Clinical Data: Lung mass.  Pericardial effusion.  CT ABDOMEN AND PELVIS WITH CONTRAST  Technique:  Multidetector CT imaging of the abdomen and pelvis was performed following the standard protocol during bolus administration of intravenous contrast.  Contrast:  90 ml Omnipaque-300  Comparison: Chest x-ray dated 09/11/2011  Findings: The patient has a large left pleural effusion and a moderate right effusion with secondary compressive atelectasis of the lower lobes.  Slight thickening of the pericardium.  Recent pericardial window.  Heart size is normal.  The liver, spleen, pancreas, adrenal glands, and kidneys demonstrate no significant abnormalities.  Small benign appearing renal cysts bilaterally.  Prior cholecystectomy with secondary slight dilatation of the common hepatic and common bile duct. Two tiny nonenhancing low density lesions in the right lobe of the liver, most likely tiny cysts.  The bowel appears normal except for scattered diverticula in the distal colon.  Small amount of nonspecific free fluid in the pelvis.  Uterus has been removed.  Right ovary is normal.  The left ovary is normal.  No significant osseous abnormality.  IMPRESSION:  1.  No significant abnormality of the abdomen. No evidence of primary or metastatic tumor in the abdomen or pelvis.  2.  Large left effusion.  Moderate right effusion.  Original Report Authenticated By: Fayrene Fearing  H. Jena Gauss, M.D.   Dg Chest Port 1 View  09/14/2011  *RADIOLOGY REPORT*  Clinical Data: Follow up pleural effusion.  PORTABLE CHEST - 1 VIEW  Comparison: Multiple prior chest x-rays.  Findings: The cardiac silhouette, mediastinal and hilar contours are stable.  Persistent right upper lobe lung mass.  The pleural effusions are slightly improved.  Persistent bibasilar infiltrates or atelectasis.  IMPRESSION:  1.  Persistent right upper lobe lung mass. 2.  Slight improved small bilateral pleural effusions and bibasilar infiltrates or atelectasis.   Original Report Authenticated By: P. Loralie Champagne, M.D.   Dg Chest Port 1 View  09/11/2011  *RADIOLOGY REPORT*  Clinical Data: Short of breath  PORTABLE CHEST - 1 VIEW  Comparison: 09/09/2011  Findings: Improvement in bilateral airspace disease which may be due to resolving pulmonary edema.  There remains bibasilar airspace disease and pneumonia is not excluded.  There are small effusions bilaterally.  Right upper lobe density is unchanged from the  prior study and may represent carcinoma.  Chest CT is suggested.  IMPRESSION: Improving bilateral airspace disease suggesting improving edema. There remains atelectasis and effusions bilaterally, left greater than right.  Right upper lobe mass lesion again present.  CT chest suggested for further evaluation.  Original Report Authenticated By: Camelia Phenes, M.D.   Dg Chest Port 1 View  09/09/2011  *RADIOLOGY REPORT*  Clinical Data: Shortness of breath  PORTABLE CHEST - 1 VIEW  Comparison: 09/08/2011  Findings: Left greater than right bilateral lower lobe airspace disease noted.  Small bilateral effusions persist.  A few Kerley B lines are noted.  Right suprahilar mass is stable.  Mild enlargement of the cardiomediastinal silhouette noted without overt edema.  IMPRESSION: Stable left greater than right bilateral lower lobe airspace opacities which could represent asymmetric edema in the setting of mild interstitial edema or pneumonia.  Right suprahilar mass again noted, for which further evaluation with chest CT with IV contrast was previously recommended if not already performed at an outside institution.  Original Report Authenticated By: Harrel Lemon, M.D.   Dg Chest Port 1 View  09/08/2011  *RADIOLOGY REPORT*  Clinical Data: Productive cough.  Fever.  Pericardial effusion. Lung mass.  PORTABLE CHEST - 1 VIEW  Comparison: 09/07/2011  Findings: Endotracheal tube has been removed.  Pericardial drain remains in place and heart size remains within normal  limits.  Increased bibasilar pulmonary opacity is seen likely representing combination of pleural effusion and atelectasis.  Mass in the medial right upper lobe is stable.  IMPRESSION:  1.  Increased bibasilar opacity, likely due to combination of pleural effusions and atelectasis. 2.  Stable mass in the medial right upper lobe.  Original Report Authenticated By: Danae Orleans, M.D.   Dg Chest Port 1 View  09/07/2011  *RADIOLOGY REPORT*  Clinical Data: Pericardial effusion.  Status post pericardial window. Lung carcinoma.  PORTABLE CHEST - 1 VIEW  Comparison: 09/07/2011  Findings: Pericardial drain remains in place and heart size remains within normal limits.  Endotracheal tube remains in appropriate position.  Probable tiny left pleural effusion and mild left basilar atelectasis again noted.  5 cm mass in the medial right upper lobe is stable.  IMPRESSION:  1.  Normal heart size with pericardial drain in place. 2.  Stable probable tiny left pleural effusion and left basilar atelectasis.  Three stable right upper lobe mass.  Original Report Authenticated By: Danae Orleans, M.D.   Dg Chest Port 1 View  09/07/2011  *RADIOLOGY REPORT*  Clinical Data: Window.  PORTABLE CHEST - 1 VIEW  Comparison: None.  Findings: Endotracheal tube is present with the tip 31 mm from the carina.  Right paratracheal soft tissue density mass is present, possibly related to the right hilum.  This measures 51 mm x 38 mm. Appearance is suspicious for neoplasm.  Follow-up chest CT should be considered if not already performed.  Pericardial drain is present.  Cardiopericardial silhouette appears within normal limits for projection.  There is no pneumothorax identified.  IMPRESSION:  1.  Endotracheal tube 31 mm from the carina. 2.  Pericardial drain is present with normal size of the cardiopericardial silhouette. 3.  Right suprahilar / paratracheal mass suspicious for neoplasm. Follow-up chest CT, preferably with infusion, should be  considered if this is a new finding.  Original Report Authenticated By: Andreas Newport, M.D.    Medications: Scheduled Meds:    . albuterol  2.5 mg Nebulization BID  . ALPRAZolam  1 mg Oral TID  . antiseptic oral rinse  15 mL Mouth Rinse BID  . digoxin  0.25 mg Oral Daily  . enoxaparin (LOVENOX) injection  45 mg Subcutaneous Q12H  . feeding supplement  1 Container Oral BID  . Fluticasone-Salmeterol  1 puff Inhalation BID  . levofloxacin  500 mg Oral Daily  . loratadine  10 mg Oral Daily  . metoCLOPramide  5 mg Oral TID AC & HS  . montelukast  10 mg Oral QHS  . pantoprazole  40 mg Oral Daily  . potassium chloride  20 mEq Oral Daily  . sodium chloride  3 mL Intravenous Q12H  . tiotropium  18 mcg Inhalation Daily  . venlafaxine  75 mg Oral Q breakfast   Continuous Infusions:    Assessment/Plan: Principal Problem:  *Pericardial effusion, acute: - Status post pericardial window on 09/06/2011, post op care per [facility]  Active Problems:  Pulmonary embolism, DVT (deep venous thrombosis): Continue full dose Lovenox   Pleural effusion, malignant: Unclear if patient had thoracentesis done in conjunction with the pericardial window, but CT abdomen pelvis done showed large left pleural effusion and moderate right effusion.  - [facility] aware, will await their final recommendations for best possible option and long-term solution.  Afib, hypertension: Rate controlled, on full dose Lovenox, and cardiology following.   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 Reviewed oncology note by Dr. Arline Asp, recommend complete staging with CT scans, brain MRI, bone scan, CEA. Patient requests no tests over the weekend. She will need to have a followup plan as she lives in [place], [place]. - Brain MRI, Nuclear medicine bone scan pending, patient states that she  cannot lie flat due to dyspnea and wants the pleural fluid removed before the imagings.    Anxiety state: Continue Xanax    LOS: 10 days   RAI,RIPUDEEP 09/16/2011, 9:10 AM

## 2011-09-16 NOTE — Progress Notes (Signed)
Utilization Review Completed.Janique Hoefer T12/17/2012   

## 2011-09-17 ENCOUNTER — Inpatient Hospital Stay (HOSPITAL_COMMUNITY): Payer: Medicare Other

## 2011-09-17 ENCOUNTER — Other Ambulatory Visit: Payer: Self-pay

## 2011-09-17 ENCOUNTER — Other Ambulatory Visit (HOSPITAL_COMMUNITY): Payer: Medicare Other

## 2011-09-17 DIAGNOSIS — K5900 Constipation, unspecified: Secondary | ICD-10-CM | POA: Diagnosis not present

## 2011-09-17 LAB — BASIC METABOLIC PANEL
BUN: 10 mg/dL (ref 6–23)
Calcium: 9.7 mg/dL (ref 8.4–10.5)
GFR calc non Af Amer: >90 mL/min (ref >90)
Glucose, Bld: 118 mg/dL — ABNORMAL HIGH (ref 70–99)

## 2011-09-17 LAB — CBC
HCT: 35.3 % — ABNORMAL LOW (ref 36.0–46.0)
Hemoglobin: 10 g/dL — ABNORMAL LOW (ref 12.0–15.0)
MCH: 25.2 pg — ABNORMAL LOW (ref 26.0–34.0)
MCHC: 28.3 g/dL — ABNORMAL LOW (ref 30.0–36.0)
MCV: 88.9 fL (ref 78.0–100.0)

## 2011-09-17 LAB — BODY FLUID CELL COUNT WITH DIFFERENTIAL
Lymphs, Fluid: 51 %
Monocyte-Macrophage-Serous Fluid: 11 % — ABNORMAL LOW (ref 50–90)

## 2011-09-17 LAB — LACTATE DEHYDROGENASE, PLEURAL OR PERITONEAL FLUID: LD, Fluid: 512 U/L — ABNORMAL HIGH (ref 3–23)

## 2011-09-17 MED ORDER — GADOBENATE DIMEGLUMINE 529 MG/ML IV SOLN
10.0000 mL | Freq: Once | INTRAVENOUS | Status: AC | PRN
  Administered 2011-09-17: 10 mL via INTRAVENOUS

## 2011-09-17 MED ORDER — DOCUSATE SODIUM 100 MG PO CAPS
100.0000 mg | ORAL_CAPSULE | Freq: Two times a day (BID) | ORAL | Status: DC
  Administered 2011-09-17 – 2011-09-19 (×5): 100 mg via ORAL
  Filled 2011-09-17 (×6): qty 1

## 2011-09-17 MED ORDER — TECHNETIUM TC 99M MEDRONATE IV KIT
25.0000 | PACK | Freq: Once | INTRAVENOUS | Status: AC | PRN
  Administered 2011-09-17: 25 via INTRAVENOUS

## 2011-09-17 NOTE — Progress Notes (Signed)
Patient ID: Kristen Keller, female   DOB: December 08, 1950, 60 y.o.   MRN: [id number] Tired, just got back from thoracentesis 500 cc "bloody' fluid drained- studies pending Wound OK Recommend repeat CXR in 3 -4 weeks to check for recurrence of pleural effusion(sooner if symptoms warrant)

## 2011-09-17 NOTE — Progress Notes (Signed)
Kristen Keller  ZOX:[id number]  DOB: 1951-08-03  DOA: 09/06/2011  Subjective: Her breathing has improved.  She just had the Pet scan and MRI.  She feels much better now.  She is a little constipated.   Objective: Weight change: 1.225 kg (2 lb 11.2 oz)  Intake/Output Summary (Last 24 hours) at 09/17/11 1456 Last data filed at 09/17/11 0459  Gross per 24 hour  Intake    483 ml  Output    850 ml  Net   -367 ml   Blood pressure 116/57, pulse 104, temperature 98 F (36.7 C), temperature source Oral, resp. rate 18, height 4\' 11"  (1.499 m), weight 43.908 kg (96 lb 12.8 oz), SpO2 97.00%.  Physical Exam: General: Alert and awake, oriented x3, not in any acute distress. HEENT: anicteric sclera, pupils reactive to light and accommodation, EOMI CVS: S1-S2 clear, no murmur rubs or gallops, irregular Chest: Decreased breath sounds, minimal wheezing Abdomen: soft nontender, nondistended, normal bowel sounds, no organomegaly Extremities: no cyanosis, clubbing or edema noted bilaterally Neuro: nonfocal  Lab Results: Basic Metabolic Panel:  Lab 09/17/11 8119 09/13/11 1015  NA 135 138  K 4.4 4.1  CL 90* 93*  CO2 38* 39*  GLUCOSE 118* 101*  BUN 10 5*  CREATININE 0.57 0.39*  CALCIUM 9.7 9.4  MG -- --  PHOS -- --   Liver Function Tests:  Lab 09/13/11 0600  AST 17  ALT 115*  ALKPHOS 68  BILITOT 0.3  PROT 7.1  ALBUMIN 2.4*   CBC:  Lab 09/17/11 0545 09/16/11 0630  WBC 15.9* 16.7*  NEUTROABS -- --  HGB 10.0* 10.3*  HCT 35.3* 34.9*  MCV 88.9 88.8  PLT 534* 469*   CBG:  Lab 09/15/11 0609 09/12/11 1640 09/12/11 1156 09/12/11 0759 09/11/11 2136  GLUCAP 81 119* 151* 113* 153*     Micro Results: No results found for this or any previous visit (from the past 240 hour(s)).  Studies/Results: Ct Abdomen Pelvis W Contrast  09/13/2011  *RADIOLOGY REPORT*  Clinical Data: Lung mass.  Pericardial effusion.  CT ABDOMEN AND PELVIS WITH CONTRAST  Technique:  Multidetector CT imaging of  the abdomen and pelvis was performed following the standard protocol during bolus administration of intravenous contrast.  Contrast:  90 ml Omnipaque-300  Comparison: Chest x-ray dated 09/11/2011  Findings: The patient has a large left pleural effusion and a moderate right effusion with secondary compressive atelectasis of the lower lobes.  Slight thickening of the pericardium.  Recent pericardial window.  Heart size is normal.  The liver, spleen, pancreas, adrenal glands, and kidneys demonstrate no significant abnormalities.  Small benign appearing renal cysts bilaterally.  Prior cholecystectomy with secondary slight dilatation of the common hepatic and common bile duct. Two tiny nonenhancing low density lesions in the right lobe of the liver, most likely tiny cysts.  The bowel appears normal except for scattered diverticula in the distal colon.  Small amount of nonspecific free fluid in the pelvis.  Uterus has been removed.  Right ovary is normal.  The left ovary is normal.  No significant osseous abnormality.  IMPRESSION:  1.  No significant abnormality of the abdomen. No evidence of primary or metastatic tumor in the abdomen or pelvis.  2.  Large left effusion.  Moderate right effusion.  Original Report Authenticated By: Gwynn Burly, M.D.   Dg Chest Port 1 View  09/14/2011  *RADIOLOGY REPORT*  Clinical Data: Follow up pleural effusion.  PORTABLE CHEST - 1 VIEW  Comparison: Multiple  prior chest x-rays.  Findings: The cardiac silhouette, mediastinal and hilar contours are stable.  Persistent right upper lobe lung mass.  The pleural effusions are slightly improved.  Persistent bibasilar infiltrates or atelectasis.  IMPRESSION:  1.  Persistent right upper lobe lung mass. 2.  Slight improved small bilateral pleural effusions and bibasilar infiltrates or atelectasis.  Original Report Authenticated By: P. Loralie Champagne, M.D.   Dg Chest Port 1 View  09/11/2011  *RADIOLOGY REPORT*  Clinical Data: Short of  breath  PORTABLE CHEST - 1 VIEW  Comparison: 09/09/2011  Findings: Improvement in bilateral airspace disease which may be due to resolving pulmonary edema.  There remains bibasilar airspace disease and pneumonia is not excluded.  There are small effusions bilaterally.  Right upper lobe density is unchanged from the  prior study and may represent carcinoma.  Chest CT is suggested.  IMPRESSION: Improving bilateral airspace disease suggesting improving edema. There remains atelectasis and effusions bilaterally, left greater than right.  Right upper lobe mass lesion again present.  CT chest suggested for further evaluation.  Original Report Authenticated By: Camelia Phenes, M.D.   Dg Chest Port 1 View  09/09/2011  *RADIOLOGY REPORT*  Clinical Data: Shortness of breath  PORTABLE CHEST - 1 VIEW  Comparison: 09/08/2011  Findings: Left greater than right bilateral lower lobe airspace disease noted.  Small bilateral effusions persist.  A few Kerley B lines are noted.  Right suprahilar mass is stable.  Mild enlargement of the cardiomediastinal silhouette noted without overt edema.  IMPRESSION: Stable left greater than right bilateral lower lobe airspace opacities which could represent asymmetric edema in the setting of mild interstitial edema or pneumonia.  Right suprahilar mass again noted, for which further evaluation with chest CT with IV contrast was previously recommended if not already performed at an outside institution.  Original Report Authenticated By: Harrel Lemon, M.D.   Dg Chest Port 1 View  09/08/2011  *RADIOLOGY REPORT*  Clinical Data: Productive cough.  Fever.  Pericardial effusion. Lung mass.  PORTABLE CHEST - 1 VIEW  Comparison: 09/07/2011  Findings: Endotracheal tube has been removed.  Pericardial drain remains in place and heart size remains within normal limits.  Increased bibasilar pulmonary opacity is seen likely representing combination of pleural effusion and atelectasis.  Mass in the  medial right upper lobe is stable.  IMPRESSION:  1.  Increased bibasilar opacity, likely due to combination of pleural effusions and atelectasis. 2.  Stable mass in the medial right upper lobe.  Original Report Authenticated By: Danae Orleans, M.D.   Dg Chest Port 1 View  09/07/2011  *RADIOLOGY REPORT*  Clinical Data: Pericardial effusion.  Status post pericardial window. Lung carcinoma.  PORTABLE CHEST - 1 VIEW  Comparison: 09/07/2011  Findings: Pericardial drain remains in place and heart size remains within normal limits.  Endotracheal tube remains in appropriate position.  Probable tiny left pleural effusion and mild left basilar atelectasis again noted.  5 cm mass in the medial right upper lobe is stable.  IMPRESSION:  1.  Normal heart size with pericardial drain in place. 2.  Stable probable tiny left pleural effusion and left basilar atelectasis.  Three stable right upper lobe mass.  Original Report Authenticated By: Danae Orleans, M.D.   Dg Chest Port 1 View  09/07/2011  *RADIOLOGY REPORT*  Clinical Data: Window.  PORTABLE CHEST - 1 VIEW  Comparison: None.  Findings: Endotracheal tube is present with the tip 31 mm from the carina.  Right paratracheal soft  tissue density mass is present, possibly related to the right hilum.  This measures 51 mm x 38 mm. Appearance is suspicious for neoplasm.  Follow-up chest CT should be considered if not already performed.  Pericardial drain is present.  Cardiopericardial silhouette appears within normal limits for projection.  There is no pneumothorax identified.  IMPRESSION:  1.  Endotracheal tube 31 mm from the carina. 2.  Pericardial drain is present with normal size of the cardiopericardial silhouette. 3.  Right suprahilar / paratracheal mass suspicious for neoplasm. Follow-up chest CT, preferably with infusion, should be considered if this is a new finding.  Original Report Authenticated By: Andreas Newport, M.D.    Medications: Scheduled Meds:    .  ALPRAZolam  1 mg Oral TID  . antiseptic oral rinse  15 mL Mouth Rinse BID  . digoxin  0.25 mg Oral Daily  . enoxaparin (LOVENOX) injection  45 mg Subcutaneous Q12H  . feeding supplement  1 Container Oral BID  . Fluticasone-Salmeterol  1 puff Inhalation BID  . levofloxacin  500 mg Oral Daily  . loratadine  10 mg Oral Daily  . metoCLOPramide  5 mg Oral TID AC & HS  . montelukast  10 mg Oral QHS  . pantoprazole  40 mg Oral Daily  . potassium chloride  20 mEq Oral Daily  . sodium chloride  3 mL Intravenous Q12H  . tiotropium  18 mcg Inhalation Daily  . venlafaxine  75 mg Oral Q breakfast  . DISCONTD: albuterol  2.5 mg Nebulization BID   Continuous Infusions:    Assessment/Plan: Principal Problem:  *Pericardial effusion, acute: - Status post pericardial window on 09/06/2011, post op care per [facility]  Active Problems:  Pulmonary embolism, DVT (deep venous thrombosis): Continue full dose Lovenox   Pleural effusion, malignant: Patient had 500 cc fluids drained.  Afib, hypertension:  Rate controlled, on full dose Lovenox;  and cardiology following.   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 Reviewed oncology note by Dr. Arline Asp, recommend complete staging with CT scans, brain MRI, bone scan, CEA.  Per Patient she went down for those earlier today. Awaiting results.  - Brain MRI, Nuclear medicine bone scan results pending, . Anxiety state: Continue Xanax  Constipation:  Restart colace.  Per patient she takes it at home.    LOS: 11 days   Earlene Plater MD, [place] Pier 09/17/2011, 2:56 PM

## 2011-09-17 NOTE — Progress Notes (Signed)
Physical Therapy Treatment Patient Details Name: KEALI MCCRAW MRN: [id number] DOB: 1951/02/16 Today's Date: 09/17/2011  PT Assessment/Plan  PT - Assessment/Plan Comments on Treatment Session: pt had been to multiple tests all day and after exercise decided that she would not get up. PT Plan: Discharge plan remains appropriate Follow Up Recommendations: Home health PT Equipment Recommended: Rolling walker with 5" wheels;3 in 1 bedside comode PT Goals  Acute Rehab PT Goals PT Goal: Perform Home Exercise Program - Progress: Progressing toward goal  PT Treatment Precautions/Restrictions  Precautions Precautions: Fall Required Braces or Orthoses: No Restrictions Weight Bearing Restrictions: No Mobility (including Balance) Bed Mobility Bed Mobility:  (sat up in the bed with no assist, but did not get oob) Transfers Transfers: No Ambulation/Gait Ambulation/Gait: No    Exercise  General Exercises - Lower Extremity Heel Slides: AROM;Both;10 reps;Supine (resistance both flexion and extension) Hip ABduction/ADduction: AROM;Both;15 reps;Supine Straight Leg Raises: AROM;10 reps;Both;Supine End of Session PT - End of Session Activity Tolerance: Patient limited by fatigue Patient left: in bed;with call bell in reach;with family/visitor present (repositioned her up in bed with min assist) General Behavior During Session: [facility] for tasks performed Cognition: [facility] for tasks performed  Solita Macadam, Eliseo Gum 09/17/2011, 4:39 PM  09/17/2011  [place] Bing, PT [phone] (pager)

## 2011-09-17 NOTE — Procedures (Signed)
Left pleural effusion  US guided thorocentesis performed 500 cc bloody fluid Sent to lab  Pt tolerated well  CXR pending

## 2011-09-18 ENCOUNTER — Encounter: Payer: Self-pay | Admitting: Radiation Oncology

## 2011-09-18 DIAGNOSIS — C3410 Malignant neoplasm of upper lobe, unspecified bronchus or lung: Secondary | ICD-10-CM

## 2011-09-18 DIAGNOSIS — I82409 Acute embolism and thrombosis of unspecified deep veins of unspecified lower extremity: Secondary | ICD-10-CM

## 2011-09-18 DIAGNOSIS — I2699 Other pulmonary embolism without acute cor pulmonale: Secondary | ICD-10-CM

## 2011-09-18 DIAGNOSIS — C50919 Malignant neoplasm of unspecified site of unspecified female breast: Secondary | ICD-10-CM

## 2011-09-18 LAB — CBC
HCT: 33.6 % — ABNORMAL LOW (ref 36.0–46.0)
Hemoglobin: 9.8 g/dL — ABNORMAL LOW (ref 12.0–15.0)
MCHC: 29.2 g/dL — ABNORMAL LOW (ref 30.0–36.0)
MCV: 88.7 fL (ref 78.0–100.0)
RDW: 15.6 % — ABNORMAL HIGH (ref 11.5–15.5)
WBC: 14.3 10*3/uL — ABNORMAL HIGH (ref 4.0–10.5)

## 2011-09-18 LAB — BASIC METABOLIC PANEL
BUN: 10 mg/dL (ref 6–23)
Chloride: 93 mEq/L — ABNORMAL LOW (ref 96–112)
Creatinine, Ser: 0.46 mg/dL — ABNORMAL LOW (ref 0.50–1.10)
GFR calc Af Amer: >90 mL/min (ref >90)
Glucose, Bld: 109 mg/dL — ABNORMAL HIGH (ref 70–99)

## 2011-09-18 NOTE — Progress Notes (Signed)
Subjective: Denies any specific complaints, denies chest pain or shortness of breath  Objective: Vital signs in last 24 hours: Filed Vitals:   09/17/11 1942 09/17/11 2056 09/18/11 0517 09/18/11 0745  BP:  102/65 108/63   Pulse:  93 117   Temp:  97.5 F (36.4 C) 97.8 F (36.6 C)   TempSrc:  Oral Oral   Resp:  18 18   Height:      Weight:  45.8 kg (100 lb 15.5 oz)    SpO2: 98% 96% 96% 96%    Intake/Output Summary (Last 24 hours) at 09/18/11 1209 Last data filed at 09/18/11 0800  Gross per 24 hour  Intake    720 ml  Output      0 ml  Net    720 ml    Weight change: 1.892 kg (4 lb 2.7 oz)   Physical Exam:  General: Alert and awake, oriented x3, not in any acute distress.  HEENT: anicteric sclera, pupils reactive to light and accommodation, EOMI  CVS: S1-S2 clear, no murmur rubs or gallops, irregular  Chest: Decreased breath sounds, minimal wheezing  Abdomen: soft nontender, nondistended, normal bowel sounds, no organomegaly  Extremities: no cyanosis, clubbing or edema noted bilaterally  Neuro: nonfocal   Lab Results: Results for orders placed during the hospital encounter of 09/06/11 (from the past 24 hour(s))  CBC     Status: Abnormal   Collection Time   09/18/11  5:15 AM      Component Value Range   WBC 14.3 (*) 4.0 - 10.5 (K/uL)   RBC 3.79 (*) 3.87 - 5.11 (MIL/uL)   Hemoglobin 9.8 (*) 12.0 - 15.0 (g/dL)   HCT 04.5 (*) 40.9 - 46.0 (%)   MCV 88.7  78.0 - 100.0 (fL)   MCH 25.9 (*) 26.0 - 34.0 (pg)   MCHC 29.2 (*) 30.0 - 36.0 (g/dL)   RDW 81.1 (*) 91.4 - 15.5 (%)   Platelets 503 (*) 150 - 400 (K/uL)  BASIC METABOLIC PANEL     Status: Abnormal   Collection Time   09/18/11  5:15 AM      Component Value Range   Sodium 136  135 - 145 (mEq/L)   Potassium 4.0  3.5 - 5.1 (mEq/L)   Chloride 93 (*) 96 - 112 (mEq/L)   CO2 36 (*) 19 - 32 (mEq/L)   Glucose, Bld 109 (*) 70 - 99 (mg/dL)   BUN 10  6 - 23 (mg/dL)   Creatinine, Ser 7.82 (*) 0.50 - 1.10 (mg/dL)   Calcium 9.5   8.4 - 10.5 (mg/dL)   GFR calc non Af Amer >90  >90 (mL/min)   GFR calc Af Amer >90  >90 (mL/min)     Micro: Recent Results (from the past 240 hour(s))  PATHOLOGIST SMEAR REVIEW     Status: Normal   Collection Time   09/17/11  7:40 AM      Component Value Range Status Comment   Tech Review REACTIVE AND DEGENERATIVE MESOTHELIAL CELLS   Final     Studies/Results: Dg Chest 1 View  09/17/2011  *RADIOLOGY REPORT*  Clinical Data: Lung carcinoma.  Status post left thoracentesis.  CHEST - 1 VIEW  Comparison: None.  Findings: Left pleural effusion has nearly completely resolved.  No pneumothorax identified.  Decreased atelectasis is seen in the left lung base.  Tiny right pleural effusion or pleural thickening remains stable.  Mass like opacity is again seen in the medial right upper lobe. Asymmetric right hilar soft tissue  prominence again seen, suspicious for right hilar lymphadenopathy.  Heart size remains normal.  IMPRESSION:  1.  Near complete resolution of left pleural effusion.  No pneumothorax identified. 2.  Stable right upper lobe mass and probable aright hilar lymphadenopathy.  Original Report Authenticated By: Danae Orleans, M.D.   Dg Chest 2 View  09/17/2011  *RADIOLOGY REPORT*  Clinical Data: Cough, congestion.  CHEST - 2 VIEW  Comparison: 09/14/2011  Findings: There is hyperinflation of the lungs compatible with COPD.  Right upper lobe mass again noted, unchanged.  Left lower lobe atelectasis or infiltrate with small left effusion.  IMPRESSION: Left lower lobe atelectasis or infiltrate with small left effusion.  Right upper lobe mass.  COPD.  Original Report Authenticated By: Cyndie Chime, M.D.   Mr Laqueta Jean WN Contrast  09/17/2011  *RADIOLOGY REPORT*  Clinical Data: New diagnosis lung cancer.  Staging.  MRI HEAD WITHOUT AND WITH CONTRAST  Technique:  Multiplanar, multiecho pulse sequences of the brain and surrounding structures were obtained according to standard protocol without  and with intravenous contrast  Contrast: 10mL MULTIHANCE GADOBENATE DIMEGLUMINE 529 MG/ML IV SOLN  Comparison: None.  Findings: Enhancing mass left temporal lobe measures 7.4 x 10.3 mm with a large amount of surrounding white matter edema.  Second enhancing nodule in the inferior cerebellar vermis measures 7.3 x 7.3 mm and also contains surrounding edema.  No other enhancing lesions are identified.  Ventricle size is normal.  Negative for acute infarct.  Paranasal sinuses and mastoid sinuses are clear.  IMPRESSION: Metastatic disease to the left temporal lobe and left inferior cerebellar vermis.  Original Report Authenticated By: Camelia Phenes, M.D.   Nm Bone Scan Whole Body  09/17/2011  *RADIOLOGY REPORT*  Clinical Data: Metastatic lung cancer.  NUCLEAR MEDICINE WHOLE BODY BONE SCINTIGRAPHY  Technique:  Whole body anterior and posterior images were obtained approximately 3 hours after intravenous injection of radiopharmaceutical.  Radiopharmaceutical: 25.0 mCi technetium 28m MDP IV.  Comparison: None.  Findings: There are no areas of suspicious bony uptake to suggest bony metastatic disease.  Activity in the left forearm from the patient's IV.  Soft tissues otherwise unremarkable.  IMPRESSION: No evidence of osseous metastatic disease.  Original Report Authenticated By: Cyndie Chime, M.D.    Medications:    . ALPRAZolam  1 mg Oral TID  . antiseptic oral rinse  15 mL Mouth Rinse BID  . digoxin  0.25 mg Oral Daily  . docusate sodium  100 mg Oral BID  . enoxaparin (LOVENOX) injection  45 mg Subcutaneous Q12H  . feeding supplement  1 Container Oral BID  . Fluticasone-Salmeterol  1 puff Inhalation BID  . levofloxacin  500 mg Oral Daily  . loratadine  10 mg Oral Daily  . metoCLOPramide  5 mg Oral TID AC & HS  . montelukast  10 mg Oral QHS  . pantoprazole  40 mg Oral Daily  . potassium chloride  20 mEq Oral Daily  . sodium chloride  3 mL Intravenous Q12H  . tiotropium  18 mcg Inhalation Daily    . venlafaxine  75 mg Oral Q breakfast     Assessment: #1 Pericardial effusion, acute:  - Status post pericardial window on 09/06/2011, post op care per [facility] , Active Problems:  Pulmonary embolism, DVT (deep venous thrombosis): Continue full dose Lovenox , this is probably recommended lifelong Pleural effusion, malignant:  Repeat chest x-ray yesterday showed near-complete resolution of Patient had 500 cc fluids drained.  Afib, hypertension:  Rate controlled, on full dose Lovenox; and cardiology following.  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 Arline Asp, recommend complete staging with CT scans, brain MRI, bone scan, CEA. Per Patient she went down for those earlier today. Discussed results of the MRI with the patient.- Brain MRI, as metastatic disease, bone scan is negative. Marland Kitchen  Anxiety state: Continue Xanax  Constipation: Restart colace. Per patient she takes it at home. Will await for further recommendations from Dr. Vedia Pereyra possibly discharge home in the morning   LOS: 12 days   [facility] 09/18/2011, 12:09 PM

## 2011-09-18 NOTE — Progress Notes (Signed)
Physical Therapy Cancellation Patient Details Name: EMANIE BEHAN MRN: [id number] DOB: [birth year] Today's Date: 09/18/2011  Pt just finished walking with RN staff and refuses PT at this time.  Encouraged pt to ambulate with RN again this PM.  Will attempt to see tomorrow.  Thanks!!!  Regan Mcbryar 09/18/2011, 2:10 PM [phone]

## 2011-09-18 NOTE — Progress Notes (Signed)
Subjective: Events since 12/17 noted. Pt stable overnight. MRI of brain 12/18 c contrast now shows Enhancing mass left temporal lobe measures 7.4 x 10.3 mm with a large amount of surrounding white matter edema. Second enhancing nodule in the inferior cerebellar vermis measures 7.3 x 7.3 mm and also contains surrounding edema. No other enhancing lesions are identified, consistent with metastatic disease. Bone scan 12/18 negative for metastatic osseous disease. CT A/P 12/14 w contrast negative. CEA 2.6; LDH 230 Had thoracentesis on 12/18 yielding 500 cc bloody fluid with breathing improved.    Objective: Vital signs in last 24 hours: BP 115/68  Pulse 70  Temp(Src) 98 F (36.7 C) (Oral)  Resp 18  Ht 4\' 11"  (1.499 m)  Wt 100 lb 15.5 oz (45.8 kg)  BMI 20.39 kg/m2  SpO2 94%   Physical Exam:  60 y.o. in no acute distress A. and O. X3. Flat affect.  HEENT: Sclera anicteric. Oral cavity without thrush or lesions.  Neck supple.no cervical or supraclavicular adenopathy  Lungs:decreased breath sounds on L when compared to  >R. No wheezing, rhonchi or rales.  CV regular rate and rhythm normal S1-S2, no murmur , rubs or gallops  Abdomen soft nontender , bowel sounds x4 no hepatosplenomegaly  GU/rectal: deferred.  Extremities: no clubbing cyanosis . No edema. Bruising at L venipuncture site. No petechial rash  Neurologic: non foc   Lab Results: Labs:  CBC   Lab 09/18/11 0515 09/17/11 0545 09/16/11 0630 09/13/11 1015 09/13/11 0600  WBC 14.3* 15.9* 16.7* 12.2* 12.9*  HGB 9.8* 10.0* 10.3* 8.7* 9.1*  HCT 33.6* 35.3* 34.9* 29.7* 31.6*  PLT 503* 534* 469* 277 285  MCV 88.7 88.9 88.8 90.8 90.5  MCH 25.9* 25.2* 26.2 26.6 26.1  MCHC 29.2* 28.3* 29.5* 29.3* 28.8*  RDW 15.6* 15.5 15.5 15.4 15.4  LYMPHSABS -- -- -- -- --  MONOABS -- -- -- -- --  EOSABS -- -- -- -- --  BASOSABS -- -- -- -- --  BANDABS -- -- -- -- --    CMP    Lab 09/18/11 0515 09/17/11 0545 09/13/11 1015 09/13/11 0600  NA  136 135 138 141  K 4.0 4.4 4.1 4.0  CL 93* 90* 93* 94*  CO2 36* 38* 39* 41*  GLUCOSE 109* 118* 101* 91  BUN 10 10 5* 4*  CREATININE 0.46* 0.57 0.39* 0.45*  CALCIUM 9.5 9.7 9.4 9.6  MG -- -- -- --  AST -- -- -- 17  ALT -- -- -- 115*  ALKPHOS -- -- -- 68  BILITOT -- -- -- 0.3        Component Value Date/Time   BILITOT 0.3 09/13/2011 0600      Imaging Studies:  Dg Chest 1 View  09/17/2011  *RADIOLOGY REPORT*  Clinical Data: Lung carcinoma.  Status post left thoracentesis.  CHEST - 1 VIEW  Comparison: None.  Findings: Left pleural effusion has nearly completely resolved.  No pneumothorax identified.  Decreased atelectasis is seen in the left lung base.  Tiny right pleural effusion or pleural thickening remains stable.  Mass like opacity is again seen in the medial right upper lobe. Asymmetric right hilar soft tissue prominence again seen, suspicious for right hilar lymphadenopathy.  Heart size remains normal.  IMPRESSION:  1.  Near complete resolution of left pleural effusion.  No pneumothorax identified. 2.  Stable right upper lobe mass and probable aright hilar lymphadenopathy.  Original Report Authenticated By: Danae Orleans, M.D.   Dg Chest 2 View  09/17/2011  *RADIOLOGY REPORT*  Clinical Data: Cough, congestion.  CHEST - 2 VIEW  Comparison: 09/14/2011  Findings: There is hyperinflation of the lungs compatible with COPD.  Right upper lobe mass again noted, unchanged.  Left lower lobe atelectasis or infiltrate with small left effusion.  IMPRESSION: Left lower lobe atelectasis or infiltrate with small left effusion.  Right upper lobe mass.  COPD.  Original Report Authenticated By: Cyndie Chime, M.D.   Mr Laqueta Jean AV Contrast  09/17/2011  *RADIOLOGY REPORT*  Clinical Data: New diagnosis lung cancer.  Staging.  MRI HEAD WITHOUT AND WITH CONTRAST  Technique:  Multiplanar, multiecho pulse sequences of the brain and surrounding structures were obtained according to standard protocol  without and with intravenous contrast  Contrast: 10mL MULTIHANCE GADOBENATE DIMEGLUMINE 529 MG/ML IV SOLN  Comparison: None.  Findings: Enhancing mass left temporal lobe measures 7.4 x 10.3 mm with a large amount of surrounding white matter edema.  Second enhancing nodule in the inferior cerebellar vermis measures 7.3 x 7.3 mm and also contains surrounding edema.  No other enhancing lesions are identified.  Ventricle size is normal.  Negative for acute infarct.  Paranasal sinuses and mastoid sinuses are clear.  IMPRESSION: Metastatic disease to the left temporal lobe and left inferior cerebellar vermis.  Original Report Authenticated By: Camelia Phenes, M.D.   Nm Bone Scan Whole Body  09/17/2011  *RADIOLOGY REPORT*  Clinical Data: Metastatic lung cancer.  NUCLEAR MEDICINE WHOLE BODY BONE SCINTIGRAPHY  Technique:  Whole body anterior and posterior images were obtained approximately 3 hours after intravenous injection of radiopharmaceutical.  Radiopharmaceutical: 25.0 mCi technetium 69m MDP IV.  Comparison: None.  Findings: There are no areas of suspicious bony uptake to suggest bony metastatic disease.  Activity in the left forearm from the patient's IV.  Soft tissues otherwise unremarkable.  IMPRESSION: No evidence of osseous metastatic disease.  Original Report Authenticated By: Cyndie Chime, M.D.     Assessment/Plan:  1.locally advanced adenocarcinoma of the right upper lobe of lung with malignant pericardial effusion, + cytology, presenting with tamponade in the setting of oxygen-dependent COPD x 5 years, history of pulmonary emboli about 5 years ago, recent history of a right leg DVT while on coumadin (?therapeutic INR), about a 50 lb weight loss over the past year and now marked deconditioning s/p pericardial window. Pt and family were told that the tumor is inoperable and incurable. The patient lives in [place], [place] and is almost always with an Geophysicist/field seismologist.tumor is being tested for EGFR and  the ALK translocation. In general, outlook here is not very favorable. Pt has a limited code status presently. Any treatment she might receive would be administered at the [facility], about 20 minutes away. Under ideal circumstances, with a pt with a good performance status, we would treat locally advanced lung cancer with combined radiation and chemotherapy if strong enough to undergo these treatments. Bone scan 12/18 negative for metastatic osseous disease. CT A/P 12/14 w contrast negative. However,  MRI of brain 12/18 c contrast demonstrates 2 new lesions at left temporal lobe measures 7.4 x 10.3 mm with a large amount of surrounding white matter edema and a second enhancing nodule in the inferior cerebellar vermis measures 7.3 x 7.3 mm and also contains surrounding edema, consistent with mets. Radiation Oncology has been contacted for evaluation. Will await for consultation. Feel free to contact Dr. Arline Asp on pager [phone] if any questions or concerns.  2.Pericardial effusion, acute: for thoracentesis 12/18 with some relief.  3.Pulmonary embolism/ DVT (deep venous thrombosis) Lovenox  Other medical problems as per respective care teams    LOS: 12 days   [facility] 09/18/2011, 2:51 PM   MRI of brain reviewed.  Patient will need Decadron and whole brain XRT.  Prognosis remains poor.  Recommend further discussion of EOL issues.  If pt and family decide to forego treatment, she needs to be referred for Hospice services.  Samul Dada 09/19/2011 8:57 AM

## 2011-09-19 LAB — CBC
Hemoglobin: 9.4 g/dL — ABNORMAL LOW (ref 12.0–15.0)
MCH: 25.6 pg — ABNORMAL LOW (ref 26.0–34.0)
MCHC: 29 g/dL — ABNORMAL LOW (ref 30.0–36.0)
MCV: 88.3 fL (ref 78.0–100.0)

## 2011-09-19 LAB — BASIC METABOLIC PANEL
BUN: 9 mg/dL (ref 6–23)
CO2: 32 mEq/L (ref 19–32)
Calcium: 9 mg/dL (ref 8.4–10.5)
Creatinine, Ser: 0.5 mg/dL (ref 0.50–1.10)
GFR calc non Af Amer: >90 mL/min (ref >90)
Glucose, Bld: 114 mg/dL — ABNORMAL HIGH (ref 70–99)
Sodium: 135 mEq/L (ref 135–145)

## 2011-09-19 MED ORDER — LEVOFLOXACIN 500 MG PO TABS: 500.0000 mg | ORAL_TABLET | Freq: Every day | ORAL | Status: AC

## 2011-09-19 MED ORDER — ENOXAPARIN SODIUM 60 MG/0.6ML ~~LOC~~ SOLN: 45.0000 mg | Freq: Two times a day (BID) | SUBCUTANEOUS | Status: DC

## 2011-09-19 MED ORDER — MORPHINE SULFATE 15 MG PO TABS: 15.0000 mg | ORAL_TABLET | Freq: Two times a day (BID) | ORAL | Status: AC | PRN

## 2011-09-19 MED ORDER — OXYCODONE-ACETAMINOPHEN 5-325 MG PO TABS: 1.0000 | ORAL_TABLET | ORAL | Status: AC | PRN

## 2011-09-19 MED ORDER — FLUTICASONE-SALMETEROL 250-50 MCG/DOSE IN AEPB: 1.0000 | INHALATION_SPRAY | Freq: Two times a day (BID) | RESPIRATORY_TRACT | Status: AC

## 2011-09-19 MED ORDER — DSS 100 MG PO CAPS: 100.0000 mg | ORAL_CAPSULE | Freq: Two times a day (BID) | ORAL | Status: AC

## 2011-09-19 MED ORDER — TIOTROPIUM BROMIDE MONOHYDRATE 18 MCG IN CAPS: 18.0000 ug | ORAL_CAPSULE | Freq: Every day | RESPIRATORY_TRACT | Status: AC

## 2011-09-19 NOTE — Progress Notes (Signed)
Subjective:  No chest pain or dyspnea.  Rhythm remains NSR.   Objective:  Vital Signs in the last 24 hours: Temp:  [98 F (36.7 C)-98.5 F (36.9 C)] 98.5 F (36.9 C) (12/19 2129) Pulse Rate:  [70-100] 100  (12/19 2129) Resp:  [18] 18  (12/19 2129) BP: (113-115)/(67-68) 113/67 mmHg (12/19 2129) SpO2:  [91 %-96 %] 91 % (12/20 0847)  Intake/Output from previous day: 12/19 0701 - 12/20 0700 In: 480 [P.O.:480] Out: -  Intake/Output from this shift:       . ALPRAZolam  1 mg Oral TID  . antiseptic oral rinse  15 mL Mouth Rinse BID  . digoxin  0.25 mg Oral Daily  . docusate sodium  100 mg Oral BID  . enoxaparin (LOVENOX) injection  45 mg Subcutaneous Q12H  . feeding supplement  1 Container Oral BID  . Fluticasone-Salmeterol  1 puff Inhalation BID  . levofloxacin  500 mg Oral Daily  . loratadine  10 mg Oral Daily  . metoCLOPramide  5 mg Oral TID AC & HS  . montelukast  10 mg Oral QHS  . pantoprazole  40 mg Oral Daily  . potassium chloride  20 mEq Oral Daily  . sodium chloride  3 mL Intravenous Q12H  . tiotropium  18 mcg Inhalation Daily  . venlafaxine  75 mg Oral Q breakfast      Physical Exam: The patient appears to be in no distress.  Head and neck exam reveals that the pupils are equal and reactive.  The extraocular movements are full.  There is no scleral icterus.  Mouth and pharynx are benign.  No lymphadenopathy.  No carotid bruits.  The jugular venous pressure is normal.  Thyroid is not enlarged or tender.  Chest decreased breath sounds at bases.  Heart reveals no abnormal lift or heave.  First and second heart sounds are normal.  There is no murmur gallop rub or click.  The abdomen is soft and nontender.  Bowel sounds are normoactive.  There is no hepatosplenomegaly or mass.  There are no abdominal bruits.     Integument reveals no rash  Lab Results:  Basename 09/19/11 0615 09/18/11 0515  WBC 13.2* 14.3*  HGB 9.4* 9.8*  PLT 518* 503*    Basename  09/19/11 0615 09/18/11 0515  NA 135 136  K 3.8 4.0  CL 91* 93*  CO2 32 36*  GLUCOSE 114* 109*  BUN 9 10  CREATININE 0.50 0.46*   No results found for this basename: TROPONINI:2,CK,MB:2 in the last 72 hours Hepatic Function Panel No results found for this basename: PROT,ALBUMIN,AST,ALT,ALKPHOS,BILITOT,BILIDIR,IBILI in the last 72 hours No results found for this basename: CHOL in the last 72 hours No results found for this basename: PROTIME in the last 72 hours  Imaging: Imaging results have been reviewed  Cardiac Studies:  Assessment/Plan:  Patient Active Problem List  Diagnoses  . HYPERTENSION, UNSPECIFIED  . ATRIAL FIBRILLATION  . COPD  . TOBACCO ABUSE, HX OF  . Encounter for long-term (current) use of anticoagulants  . COPD (chronic obstructive pulmonary disease)  . Hypertension  . Pulmonary embolism  . DVT (deep venous thrombosis)  . Pneumonia  . Pulmonary fibrosis  . Pericardial effusion, acute  . Pleural effusion, malignant  . Anemia  . Hypotension  . Ejection fraction  . Lung mass  . Constipation   Impression: Stable from cardiac standpoint. Cardiac followup with Dr. Myrtis Ser at the [facility].  Will sign off now.  LOS: 13 days    Cassell Clement 09/19/2011, 9:27 AM

## 2011-09-19 NOTE — Discharge Summary (Signed)
Physician Discharge Summary  KHRYSTYNE ARPIN MRN: [id number] DOB/AGE: 59/26/1952 59 y.o.  PCP: Toma Deiters, MD   Admit date: 09/06/2011 Discharge date: 09/19/2011  Discharge Diagnoses:       *Pericardial effusion, acute    Pulmonary embolism  DVT (deep venous thrombosis)  Pleural effusion, malignant  Anemia  Hypotension  Lung mass  Constipation HYPERTENSION, UNSPECIFIED  .  ATRIAL FIBRILLATION  .  COPD  .  TOBACCO ABUSE, HX OF  .  Encounter for long-term (current) use of anticoagulants  .  COPD (chronic obstructive pulmonary disease)  .  Hypertension  .     DVT (deep venous thrombosis)  .  Pneumonia  .  Pulmonary fibrosis  .  Pericardial effusion, acute  .  Pleural effusion, malignant  .  Anemia  .  Hypotension  .  Constipation      Current Discharge Medication List    START taking these medications   Details  docusate sodium 100 MG CAPS Take 100 mg by mouth 2 (two) times daily. Qty: 30 capsule, Refills: 2    enoxaparin (LOVENOX) 60 MG/0.6ML SOLN Inject 0.45 mLs (45 mg total) into the skin every 12 (twelve) hours. Qty: 16.8 mL, Refills: 10    !! Fluticasone-Salmeterol (ADVAIR) 250-50 MCG/DOSE AEPB Inhale 1 puff into the lungs 2 (two) times daily. Qty: 1 each, Refills: 1    levofloxacin (LEVAQUIN) 500 MG tablet Take 1 tablet (500 mg total) by mouth daily. Qty: 7 tablet, Refills: 0    !! tiotropium (SPIRIVA) 18 MCG inhalation capsule Place 1 capsule (18 mcg total) into inhaler and inhale daily. Qty: 30 capsule, Refills: 1     !! - Potential duplicate medications found. Please discuss with provider.    CONTINUE these medications which have CHANGED   Details  !! morphine (MSIR) 15 MG tablet Take 1 tablet (15 mg total) by mouth 2 (two) times daily as needed. For pain Qty: 30 tablet, Refills: 0    !! morphine (MSIR) 15 MG tablet Take 1 tablet (15 mg total) by mouth 2 (two) times daily as needed. Qty: 30 tablet, Refills: 0    !!  oxyCODONE-acetaminophen (PERCOCET) 5-325 MG per tablet Take 1-2 tablets by mouth every 4 (four) hours as needed. Qty: 30 tablet, Refills: 0     !! - Potential duplicate medications found. Please discuss with provider.    CONTINUE these medications which have NOT CHANGED   Details  !! ADVAIR DISKUS 250-50 MCG/DOSE AEPB Inhale 1 puff into the lungs Twice daily.    albuterol (PROVENTIL) (5 MG/ML) 0.5% nebulizer solution Inhale 1 continuous puffing into the lungs as needed. For shortness of breath    ALPRAZolam (XANAX) 1 MG tablet Take 1 tablet by mouth Three times daily as needed. For anxiety    digoxin (LANOXIN) 0.25 MG tablet Take 1 tablet (250 mcg total) by mouth daily. Qty: 90 tablet, Refills: 3    diltiazem (CARDIZEM CD) 120 MG 24 hr capsule Take 120 mg by mouth daily.      fexofenadine (ALLEGRA) 180 MG tablet Take 180 mg by mouth daily.      furosemide (LASIX) 20 MG tablet Take 1 tablet by mouth Twice daily.    lansoprazole (PREVACID) 30 MG capsule Take 1 capsule by mouth Daily.    metoCLOPramide (REGLAN) 10 MG tablet Take 5 mg by mouth 4 (four) times daily.      !! oxyCODONE-acetaminophen (PERCOCET) 5-325 MG per tablet Take 1 tablet by mouth 3 (three) times  daily as needed. For pain    PRISTIQ 50 MG 24 hr tablet Take 1 tablet by mouth Daily.    SINGULAIR 10 MG tablet Take 1 tablet by mouth Daily.    !! SPIRIVA HANDIHALER 18 MCG inhalation capsule Place 1 puff into inhaler and inhale Daily.    insulin lispro (HUMALOG) 100 UNIT/ML injection Inject 4 Units into the skin daily as needed. For high blood glucose     !! - Potential duplicate medications found. Please discuss with provider.    STOP taking these medications     spironolactone (ALDACTONE) 25 MG tablet      warfarin (COUMADIN) 2 MG tablet         Discharge Condition: Stable Disposition: Final discharge disposition not confirmed   Consults:Thomas Patty Sermons, MD of cardiology Samul Dada, MD  oncology Loreli Slot, MD, cardiothoracic surgery  Jonna Coup, MD, radiation oncology     Significant Diagnostic Studies: Dg Chest 1 View  09/17/2011  *RADIOLOGY REPORT*  Clinical Data: Lung carcinoma.  Status post left thoracentesis.  CHEST - 1 VIEW  Comparison: None.  Findings: Left pleural effusion has nearly completely resolved.  No pneumothorax identified.  Decreased atelectasis is seen in the left lung base.  Tiny right pleural effusion or pleural thickening remains stable.  Mass like opacity is again seen in the medial right upper lobe. Asymmetric right hilar soft tissue prominence again seen, suspicious for right hilar lymphadenopathy.  Heart size remains normal.  IMPRESSION:  1.  Near complete resolution of left pleural effusion.  No pneumothorax identified. 2.  Stable right upper lobe mass and probable aright hilar lymphadenopathy.  Original Report Authenticated By: Danae Orleans, M.D.   Dg Chest 2 View  09/17/2011  *RADIOLOGY REPORT*  Clinical Data: Cough, congestion.  CHEST - 2 VIEW  Comparison: 09/14/2011  Findings: There is hyperinflation of the lungs compatible with COPD.  Right upper lobe mass again noted, unchanged.  Left lower lobe atelectasis or infiltrate with small left effusion.  IMPRESSION: Left lower lobe atelectasis or infiltrate with small left effusion.  Right upper lobe mass.  COPD.  Original Report Authenticated By: Cyndie Chime, M.D.   Mr Laqueta Jean ZO Contrast  09/17/2011  *RADIOLOGY REPORT*  Clinical Data: New diagnosis lung cancer.  Staging.  MRI HEAD WITHOUT AND WITH CONTRAST  Technique:  Multiplanar, multiecho pulse sequences of the brain and surrounding structures were obtained according to standard protocol without and with intravenous contrast  Contrast: 10mL MULTIHANCE GADOBENATE DIMEGLUMINE 529 MG/ML IV SOLN  Comparison: None.  Findings: Enhancing mass left temporal lobe measures 7.4 x 10.3 mm with a large amount of surrounding white matter  edema.  Second enhancing nodule in the inferior cerebellar vermis measures 7.3 x 7.3 mm and also contains surrounding edema.  No other enhancing lesions are identified.  Ventricle size is normal.  Negative for acute infarct.  Paranasal sinuses and mastoid sinuses are clear.  IMPRESSION: Metastatic disease to the left temporal lobe and left inferior cerebellar vermis.  Original Report Authenticated By: Camelia Phenes, M.D.   Nm Bone Scan Whole Body  09/17/2011  *RADIOLOGY REPORT*  Clinical Data: Metastatic lung cancer.  NUCLEAR MEDICINE WHOLE BODY BONE SCINTIGRAPHY  Technique:  Whole body anterior and posterior images were obtained approximately 3 hours after intravenous injection of radiopharmaceutical.  Radiopharmaceutical: 25.0 mCi technetium 34m MDP IV.  Comparison: None.  Findings: There are no areas of suspicious bony uptake to suggest bony metastatic disease.  Activity in  the left forearm from the patient's IV.  Soft tissues otherwise unremarkable.  IMPRESSION: No evidence of osseous metastatic disease.  Original Report Authenticated By: Cyndie Chime, M.D.   Ct Abdomen Pelvis W Contrast  09/13/2011  *RADIOLOGY REPORT*  Clinical Data: Lung mass.  Pericardial effusion.  CT ABDOMEN AND PELVIS WITH CONTRAST  Technique:  Multidetector CT imaging of the abdomen and pelvis was performed following the standard protocol during bolus administration of intravenous contrast.  Contrast:  90 ml Omnipaque-300  Comparison: Chest x-ray dated 09/11/2011  Findings: The patient has a large left pleural effusion and a moderate right effusion with secondary compressive atelectasis of the lower lobes.  Slight thickening of the pericardium.  Recent pericardial window.  Heart size is normal.  The liver, spleen, pancreas, adrenal glands, and kidneys demonstrate no significant abnormalities.  Small benign appearing renal cysts bilaterally.  Prior cholecystectomy with secondary slight dilatation of the common hepatic and common  bile duct. Two tiny nonenhancing low density lesions in the right lobe of the liver, most likely tiny cysts.  The bowel appears normal except for scattered diverticula in the distal colon.  Small amount of nonspecific free fluid in the pelvis.  Uterus has been removed.  Right ovary is normal.  The left ovary is normal.  No significant osseous abnormality.  IMPRESSION:  1.  No significant abnormality of the abdomen. No evidence of primary or metastatic tumor in the abdomen or pelvis.  2.  Large left effusion.  Moderate right effusion.  Original Report Authenticated By: Gwynn Burly, M.D.   Dg Chest Port 1 View  09/14/2011  *RADIOLOGY REPORT*  Clinical Data: Follow up pleural effusion.  PORTABLE CHEST - 1 VIEW  Comparison: Multiple prior chest x-rays.  Findings: The cardiac silhouette, mediastinal and hilar contours are stable.  Persistent right upper lobe lung mass.  The pleural effusions are slightly improved.  Persistent bibasilar infiltrates or atelectasis.  IMPRESSION:  1.  Persistent right upper lobe lung mass. 2.  Slight improved small bilateral pleural effusions and bibasilar infiltrates or atelectasis.  Original Report Authenticated By: P. Loralie Champagne, M.D.   Dg Chest Port 1 View  09/11/2011  *RADIOLOGY REPORT*  Clinical Data: Short of breath  PORTABLE CHEST - 1 VIEW  Comparison: 09/09/2011  Findings: Improvement in bilateral airspace disease which may be due to resolving pulmonary edema.  There remains bibasilar airspace disease and pneumonia is not excluded.  There are small effusions bilaterally.  Right upper lobe density is unchanged from the  prior study and may represent carcinoma.  Chest CT is suggested.  IMPRESSION: Improving bilateral airspace disease suggesting improving edema. There remains atelectasis and effusions bilaterally, left greater than right.  Right upper lobe mass lesion again present.  CT chest suggested for further evaluation.  Original Report Authenticated By: Camelia Phenes, M.D.   Dg Chest Port 1 View  09/09/2011  *RADIOLOGY REPORT*  Clinical Data: Shortness of breath  PORTABLE CHEST - 1 VIEW  Comparison: 09/08/2011  Findings: Left greater than right bilateral lower lobe airspace disease noted.  Small bilateral effusions persist.  A few Kerley B lines are noted.  Right suprahilar mass is stable.  Mild enlargement of the cardiomediastinal silhouette noted without overt edema.  IMPRESSION: Stable left greater than right bilateral lower lobe airspace opacities which could represent asymmetric edema in the setting of mild interstitial edema or pneumonia.  Right suprahilar mass again noted, for which further evaluation with chest CT with IV contrast was previously  recommended if not already performed at an outside institution.  Original Report Authenticated By: Harrel Lemon, M.D.   Dg Chest Port 1 View  09/08/2011  *RADIOLOGY REPORT*  Clinical Data: Productive cough.  Fever.  Pericardial effusion. Lung mass.  PORTABLE CHEST - 1 VIEW  Comparison: 09/07/2011  Findings: Endotracheal tube has been removed.  Pericardial drain remains in place and heart size remains within normal limits.  Increased bibasilar pulmonary opacity is seen likely representing combination of pleural effusion and atelectasis.  Mass in the medial right upper lobe is stable.  IMPRESSION:  1.  Increased bibasilar opacity, likely due to combination of pleural effusions and atelectasis. 2.  Stable mass in the medial right upper lobe.  Original Report Authenticated By: Danae Orleans, M.D.   Dg Chest Port 1 View  09/07/2011  *RADIOLOGY REPORT*  Clinical Data: Pericardial effusion.  Status post pericardial window. Lung carcinoma.  PORTABLE CHEST - 1 VIEW  Comparison: 09/07/2011  Findings: Pericardial drain remains in place and heart size remains within normal limits.  Endotracheal tube remains in appropriate position.  Probable tiny left pleural effusion and mild left basilar atelectasis again noted.  5 cm  mass in the medial right upper lobe is stable.  IMPRESSION:  1.  Normal heart size with pericardial drain in place. 2.  Stable probable tiny left pleural effusion and left basilar atelectasis.  Three stable right upper lobe mass.  Original Report Authenticated By: Danae Orleans, M.D.   Dg Chest Port 1 View  09/07/2011  *RADIOLOGY REPORT*  Clinical Data: Window.  PORTABLE CHEST - 1 VIEW  Comparison: None.  Findings: Endotracheal tube is present with the tip 31 mm from the carina.  Right paratracheal soft tissue density mass is present, possibly related to the right hilum.  This measures 51 mm x 38 mm. Appearance is suspicious for neoplasm.  Follow-up chest CT should be considered if not already performed.  Pericardial drain is present.  Cardiopericardial silhouette appears within normal limits for projection.  There is no pneumothorax identified.  IMPRESSION:  1.  Endotracheal tube 31 mm from the carina. 2.  Pericardial drain is present with normal size of the cardiopericardial silhouette. 3.  Right suprahilar / paratracheal mass suspicious for neoplasm. Follow-up chest CT, preferably with infusion, should be considered if this is a new finding.  Original Report Authenticated By: Andreas Newport, M.D.     Microbiology: Recent Results (from the past 240 hour(s))  PATHOLOGIST SMEAR REVIEW     Status: Normal   Collection Time   09/17/11  7:40 AM      Component Value Range Status Comment   Tech Review REACTIVE AND DEGENERATIVE MESOTHELIAL CELLS   Final      Labs: Results for orders placed during the hospital encounter of 09/06/11 (from the past 48 hour(s))  CBC     Status: Abnormal   Collection Time   09/18/11  5:15 AM      Component Value Range Comment   WBC 14.3 (*) 4.0 - 10.5 (K/uL)    RBC 3.79 (*) 3.87 - 5.11 (MIL/uL)    Hemoglobin 9.8 (*) 12.0 - 15.0 (g/dL)    HCT 78.2 (*) 95.6 - 46.0 (%)    MCV 88.7  78.0 - 100.0 (fL)    MCH 25.9 (*) 26.0 - 34.0 (pg)    MCHC 29.2 (*) 30.0 - 36.0 (g/dL)     RDW 21.3 (*) 08.6 - 15.5 (%)    Platelets 503 (*) 150 - 400 (  K/uL)   BASIC METABOLIC PANEL     Status: Abnormal   Collection Time   09/18/11  5:15 AM      Component Value Range Comment   Sodium 136  135 - 145 (mEq/L)    Potassium 4.0  3.5 - 5.1 (mEq/L)    Chloride 93 (*) 96 - 112 (mEq/L)    CO2 36 (*) 19 - 32 (mEq/L)    Glucose, Bld 109 (*) 70 - 99 (mg/dL)    BUN 10  6 - 23 (mg/dL)    Creatinine, Ser 6.21 (*) 0.50 - 1.10 (mg/dL)    Calcium 9.5  8.4 - 10.5 (mg/dL)    GFR calc non Af Amer >90  >90 (mL/min)    GFR calc Af Amer >90  >90 (mL/min)   CBC     Status: Abnormal   Collection Time   09/19/11  6:15 AM      Component Value Range Comment   WBC 13.2 (*) 4.0 - 10.5 (K/uL)    RBC 3.67 (*) 3.87 - 5.11 (MIL/uL)    Hemoglobin 9.4 (*) 12.0 - 15.0 (g/dL)    HCT 30.8 (*) 65.7 - 46.0 (%)    MCV 88.3  78.0 - 100.0 (fL)    MCH 25.6 (*) 26.0 - 34.0 (pg)    MCHC 29.0 (*) 30.0 - 36.0 (g/dL)    RDW 84.6  96.2 - 95.2 (%)    Platelets 518 (*) 150 - 400 (K/uL)   BASIC METABOLIC PANEL     Status: Abnormal   Collection Time   09/19/11  6:15 AM      Component Value Range Comment   Sodium 135  135 - 145 (mEq/L)    Potassium 3.8  3.5 - 5.1 (mEq/L)    Chloride 91 (*) 96 - 112 (mEq/L)    CO2 32  19 - 32 (mEq/L)    Glucose, Bld 114 (*) 70 - 99 (mg/dL)    BUN 9  6 - 23 (mg/dL)    Creatinine, Ser 8.41  0.50 - 1.10 (mg/dL)    Calcium 9.0  8.4 - 10.5 (mg/dL)    GFR calc non Af Amer >90  >90 (mL/min)    GFR calc Af Amer >90  >90 (mL/min)      HPI : [name] with history of tobacco abuse, paroxysmal atrial fibrillation, Right LE DVT November 2012, PE on chronic anticoagulation, pulmonary fibrosis, HTN, DM, gastroparesis admitted to [facility] with SOB. 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 She was also found to have pericardial tamponade. An emergent bedside echocardiogram and pericardiocentesis was performed by Dr. Andee Lineman. ~300cc bloody Fluid  was removed by Dr. Andee Lineman. Pathology of fluid is consistent with metastatic disease. Pt symptomatically felt better after pericardiocentesis. Her INR was therapeutic on admission so she was given FFP and vitamin K before the pericardiocentesis.  . She is known to have normal LV systolic function by echo at [facility] with LVEF of 55-60%.      HOSPITAL COURSE:  Principal Problem:  #1*Pericardial effusion, acute:  - Status post pericardial window on 09/06/2011, post op care per [facility]    #2 Pulmonary embolism, DVT (deep venous thrombosis): Continue full dose Lovenox , the patient will be continued on Lovenox long time, as she had developed a pulmonary embolism despite being on Coumadin. 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 Pt and family were told that the tumor is inoperable and incurable. The patient lives in [place], [place] and is almost always with an Geophysicist/field seismologist.tumor is being tested for EGFR and the ALK translocation. In general, outlook here is not very favorable. Pt has a limited code status presently. Any treatment she might receive would be administered at the [facility], about 20 minutes away. Under ideal circumstances, with a pt with a good performance status, oncology recommends that they would treat locally advanced lung cancer with combined radiation and chemotherapy if strong enough to undergo these treatments. Bone scan 12/18 negative for metastatic osseous disease. CT A/P 12/14 w contrast negative.  However, MRI of brain 12/18 c contrast demonstrates 2 new lesions at left  temporal lobe measures 7.4 x 10.3 mm with a large amount of surrounding white matter edema and a second enhancing nodule in the inferior cerebellar vermis measures 7.3 x 7.3 mm and also contains surrounding edema, consistent with mets. Radiation Oncology has been contacted and Dr. Mitzi Hansen saw the patient.    #4 Pleural effusion, malignant: Unclear if patient had thoracentesis done in conjunction with the pericardial window, but CT abdomen pelvis done yesterday shows large left pleural effusion and moderate right effusion. Patient had an ultrasound guided thoracentesis with removal of 500 cc of bloody fluid. She would need a repeat chest x-ray in 3-4 weeks, to check for recurrence.    #5 Afib, hypertension: Rate controlled, on full dose Lovenox, and cardiology following. Resume digoxin.  #6 Anemia: Obtain CBC, H&H and appears to be at baseline    #7 Anxiety state: Continue Xanax  #8 disposition we will be discharged with home health with [facility] she has a supportive family and 3 daughters live close by she will be discharged on home health PT.   Discharge Exam:  Blood pressure 113/67, pulse 100, temperature 98.5 F (36.9 C), temperature source Oral, resp. rate 18, height 4\' 11"  (1.499 m), weight 45.8 kg (100 lb 15.5 oz), SpO2 91.00%.  General: Alert, awake, oriented x3, in no acute distress. HEENT: No bruits, no goiter. Heart: Regular rate and rhythm, without murmurs, rubs, gallops. Lungs: Clear to auscultation bilaterally. Abdomen: Soft, nontender, nondistended, positive bowel sounds. Extremities: No clubbing cyanosis or edema with positive pedal pulses. Neuro: Grossly intact, nonfocal.    Discharge Orders    Future Appointments: Provider: Department: Dept Phone: Center:   10/07/2011 2:00 PM Luis Abed, MD [facility] [phone] [facility]     Future Orders Please Complete By Expires   Diet - low sodium heart healthy      Increase activity slowly      Call MD for:   temperature >100.4      Call MD for:  persistant nausea and vomiting      Call MD for:  severe uncontrolled pain      Call MD for:  difficulty breathing, headache or visual disturbances         Follow-up Information    Follow up in 1 week.with PCP         instructions for wound care per Dr. Dorris Fetch  Signed: Richarda Overlie 09/19/2011, 11:42 AM

## 2011-09-19 NOTE — Progress Notes (Signed)
   CARE MANAGEMENT NOTE 09/19/2011  Patient:  Kristen Keller, Kristen Keller   Account Number:  [id number]  Date Initiated:  09/17/2011  Documentation initiated by:  MAYO,HENRIETTA  Subjective/Objective Assessment:   60 yr-old female adm with cardiac tamponade and (L)UL mass; lives alone, active with [facility] for RN and aide.     Action/Plan:   Anticipated DC Date:  09/19/2011   Anticipated DC Plan:  HOME W HOME HEALTH SERVICES      DC Planning Services  CM consult      [facility] Choice  Resumption Of Svcs/PTA Provider   Choice offered to / List presented to:          [facility] arranged  HH-1 RN  HH-2 PT  HH-4 NURSE'S AIDE      HH agency  [facility]   Status of service:   Medicare Important Message given?   (If response is "NO", the following Medicare IM given date fields will be blank) Date Medicare IM given:   Date Additional Medicare IM given:    Discharge Disposition:  HOME W HOME HEALTH SERVICES  Per UR Regulation:  Reviewed for med. necessity/level of care/duration of stay  Comments:  PCP:  Dr. Lia Hopping 12/20 will fax dc intruct [facility], dc home today, gets o2 w [facility]. Suella Grove [phone] 09/16/11 1520 Henrietta Mayo RN MSN CCM Per pt, she has a friend that will stay with her as needed, also has 3 daughters that live nearby and are supportive. PT recommends home health PT, rolling walker, and 3-N-1. Pt agrees to referral for [facility] PT but does not want equipment - prefers to wait until she returns home to determine what equipment is needed.  TC to [facility] to discuss.

## 2011-09-19 NOTE — Progress Notes (Signed)
ANTICOAGULATION CONSULT NOTE - Follow Up Consult  Pharmacy Consult for Lovenox Indication: hx DVT/PE  Allergies  Allergen Reactions  . Codeine     REACTION: "feels like walking outside her body", itching  . Erythromycin     REACTION: hives, sob, diff. breathing  . Zolpidem Tartrate     REACTION: keeps her awake, shaking    Patient Measurements: Height: 4\' 11"  (149.9 cm) Weight: 100 lb 15.5 oz (45.8 kg) (bed weight) IBW/kg (Calculated) : 43.2   Vital Signs:    Labs:  Basename 09/19/11 0615 09/18/11 0515 09/17/11 0545  HGB 9.4* 9.8* --  HCT 32.4* 33.6* 35.3*  PLT 518* 503* 534*  APTT -- -- --  LABPROT -- -- --  INR -- -- --  HEPARINUNFRC -- -- --  CREATININE 0.50 0.46* 0.57  CKTOTAL -- -- --  CKMB -- -- --  TROPONINI -- -- --   Estimated Creatinine Clearance: 51.6 ml/min (by C-G formula based on Cr of 0.5).    Assessment: Patient is a 60 y.o. F on full dose Lovenox for hx DVT/PE.  On Coumadin PTA which was held for invasive procedures.  Now with adenocarcinoma and new dx of brain mets.  Renal fxn & CBC stable. Per MD note, considering life-long Lovenox.   Plan:  1) Continue Lovenox 45mg  sq q12h for now.   2) If plan to discharge on tx dose Lovenox, consider change to once daily dosing (1.5mg /kg/day) if appropriate.  Elson Clan 09/19/2011,11:18 AM

## 2011-09-19 NOTE — Progress Notes (Signed)
Patient seen and examined today. Patient presenting with SOB, and has finding of metastatic cancer including 2 brain mets. Additional pathologic evaluation pending, but clinically appears to represent lung cancer. Patient appears to be a good candidate for whole brain radiation, to be given at [facility] in [place]. Radiation to lungs concurrently could be given as well. I discussed this with patient. She does not wish to have anything done before Christmas once she leaves the hospital. I will set her up to be seen in [place] next week and will coordinate her care. Anticipate 10 radiation treatments over 2 weeks.  (full note dictated)

## 2011-09-20 ENCOUNTER — Encounter: Payer: Self-pay | Admitting: Thoracic Surgery (Cardiothoracic Vascular Surgery)

## 2011-09-25 ENCOUNTER — Other Ambulatory Visit: Payer: Self-pay | Admitting: Radiation Oncology

## 2011-09-25 DIAGNOSIS — C7931 Secondary malignant neoplasm of brain: Secondary | ICD-10-CM

## 2011-09-30 ENCOUNTER — Other Ambulatory Visit: Payer: Self-pay | Admitting: *Deleted

## 2011-09-30 MED ORDER — ENOXAPARIN SODIUM 60 MG/0.6ML ~~LOC~~ SOLN: 45.0000 mg | Freq: Two times a day (BID) | SUBCUTANEOUS | Status: AC

## 2011-10-03 NOTE — Progress Notes (Signed)
CC:   Salvatore Decent. Dorris Fetch, M.D. Samul Dada, M.D.  REFERRING PHYSICIAN:  Samul Dada, M.D.  DIAGNOSIS:  Metastatic lung cancer with brain metastasis.  HISTORY OF PRESENT ILLNESS:  Ms. Turley is a 61 year old female who had a right lower extremity DVT in November 2012.  She was admitted to [facility] in the setting of shortness of breath and was found to have a large right upper lobe mass consistent with malignancy.  She was also found to have pericardial tamponade and an emergent bedside echocardiogram and pericardiocentesis was performed.  The patient is now status post a pericardial window as well through Dr. Dorris Fetch. Her workup has included a CT scan of the chest which did reveal findings suspicious for lung cancer with a large right upper lobe mass present consistent with primary lung neoplasm as well as right hilar and bilateral mediastinal lymph node metastases.  A large pericardial effusion was noted on the CT scan of the chest as well.  The patient also has had a CT scan of the abdomen and pelvis and there was no significant abnormality within the abdomen and no evidence of primary or metastatic tumor in the abdomen or pelvis.  An MRI scan of the brain did reveal metastatic disease to the left temporal lobe and left inferior cerebellar vermis.  The left temporal lobe measured 7.4 x 10.3 mm and the cerebellar vermis enhancing lesion measured 7.3 x 7.3 mm.  This was therefore felt to be consistent with metastatic disease of a lung primary.  A bone scan had shown no evidence of osseous metastatic disease.  A biopsy of the pericardium from 09/06/2011 showed some inflamed mesothelial lined fibroadipose tissue with no evidence of malignancy.  Cytology from 09/17/2011 did show malignant cells consistent with adenocarcinoma from the pleural fluid on the left. Given these findings with brain metastasis,  I have been asked to see the patient for consideration of  possible palliative radiotherapy.  PAST MEDICAL HISTORY:  COPD, hypertension, bursitis, atrial fibrillation on Coumadin, multiple bouts of pneumonia, status post appendectomy, status post cholecystectomy, status post hysterectomy.  MEDICATIONS:  Prior to admission: Advair, Proventil, Xanax, digitoxin, Cardizem, Allegra, Lasix, Prevacid, Raglan, Percocet, Singulair, Pristiq, Aldactone, Coumadin, and insulin.  ALLERGIES:  Codeine causes some itching and unusual feeling. Erythromycin causes hives.  Zolpidem has kept her awake with shaking.  FAMILY HISTORY:  Noncontributory.  SOCIAL HISTORY:  The patient has smoked 2 packs per day since age 65 until age 18.  No alcohol or drug abuse.  REVIEW OF SYSTEMS:  Negative other than as above.  PHYSICAL EXAMINATION:  Afebrile and vital signs stable.  General: Somewhat elderly appearing female in no acute distress.  She was alert and oriented x3.  HEENT:  Normocephalic atraumatic.  Extraocular movements intact.  Neck:  Supple without lymphadenopathy. Cardiovascular:  Irregular rate and rhythm.  Lungs:  Some expiratory wheeze present.  GI:  Abdomen is soft, nontender.  Normal bowel sounds. Extremities:  No edema.  IMPRESSION AND PLAN:  Ms. Agrawal is a 61 year old female with a diagnosis of metastatic lung cancer.  She is presenting, unfortunately, stage IV disease with both pleural and pericardial effusions and she does have 2 small lesions consistent with metastatic disease within the brain.  I have discussed a possible course of palliative radiotherapy with the patient and her family and all of their questions were answered.  I would anticipate a short 2 week course of treatment and I discussed with them the possibility of whole brain  radiation therapy in [place], as well as treatment to the chest.  The patient's MRI scan did show a large amount of surrounding white matter edema.  Hopefully this will resolve with steroids and the patient's  symptoms can be controlled while we plan and arranged for her treatment.  The patient could conceivably also be a candidate for stereotactic radiosurgery as well if she is willing to come to [facility] after discharge and if this can be arranged within a suitable time frame given her presentation.  The patient's prognosis certainly appears poor, given the extent of her disease.  I will therefore help coordinate her care and we will begin treatment soon with regards to palliative radiation and will treat both the brain and the chest.    ______________________________ Radene Gunning, M.D., Ph.D. JSM/MEDQ  D:  09/28/2011  T:  10/03/2011  Job:  [id number]

## 2011-10-07 ENCOUNTER — Ambulatory Visit (INDEPENDENT_AMBULATORY_CARE_PROVIDER_SITE_OTHER): Payer: Medicare Other | Admitting: Physician Assistant

## 2011-10-07 ENCOUNTER — Encounter: Payer: Self-pay | Admitting: Cardiology

## 2011-10-07 ENCOUNTER — Ambulatory Visit: Payer: Medicare Other | Admitting: Cardiology

## 2011-10-07 VITALS — BP 115/80 | HR 112 | Ht 60.0 in | Wt 93.0 lb

## 2011-10-07 DIAGNOSIS — R222 Localized swelling, mass and lump, trunk: Secondary | ICD-10-CM

## 2011-10-07 DIAGNOSIS — R918 Other nonspecific abnormal finding of lung field: Secondary | ICD-10-CM

## 2011-10-07 DIAGNOSIS — Z7901 Long term (current) use of anticoagulants: Secondary | ICD-10-CM | POA: Insufficient documentation

## 2011-10-07 DIAGNOSIS — I313 Pericardial effusion (noninflammatory): Secondary | ICD-10-CM | POA: Insufficient documentation

## 2011-10-07 DIAGNOSIS — J910 Malignant pleural effusion: Secondary | ICD-10-CM

## 2011-10-07 DIAGNOSIS — J449 Chronic obstructive pulmonary disease, unspecified: Secondary | ICD-10-CM | POA: Insufficient documentation

## 2011-10-07 DIAGNOSIS — I4891 Unspecified atrial fibrillation: Secondary | ICD-10-CM

## 2011-10-07 DIAGNOSIS — I319 Disease of pericardium, unspecified: Secondary | ICD-10-CM

## 2011-10-07 DIAGNOSIS — R0602 Shortness of breath: Secondary | ICD-10-CM

## 2011-10-07 NOTE — Patient Instructions (Signed)
Follow up as scheduled. Your physician recommends that you continue on your current medications as directed. Please refer to the Current Medication list given to you today. Your physician recommends that you go to the [facility] for lab work: BMET/CBC. If the results of your test are normal or stable, you will receive a letter. If they are abnormal, the nurse will contact you by phone.

## 2011-10-07 NOTE — Progress Notes (Addendum)
HPI: Patient presents for post hospital followup.  Patient was initially seen by myself and Dr. Andee Lineman here at [facility], on December 6, when she was found to have a large pericardial effusion, by CT scan, with evidence of tamponade physiology, by echocardiography. She was initially treated with pericardiocentesis, by Dr. Andee Lineman, removing approximately 300 cc of fluid. It was felt that this was most likely related to metastatic CA. We subsequently arranged for her to be transferred to [facility], where she underwent treatment with a pericardial window, by Dr. Edwina Barth, later that same day on December 7. She was followed closely by our team at [facility], and subsequently transferred to the hospitalist service. She was discharged on December 20, with diagnosis of metastatic lung cancer with brain metastasis. She has since undergone numerous radiation treatments to the lung, and is also scheduled for one treatment to the brain, later this month. She is also in the process of establishing with a medical oncologist, here in [place], per Dr. Dorothy Puffer, radiation oncologist.  From a cardiac perspective, she remained in NSR while at [facility]. A postop 2-D echo, on December 13, yielded normal LVF (EF 55-60%), with question of a small loculated pericardial effusion.  Regarding anticoagulation, a decision was made at [facility] to treat the patient with Lovenox for her atrial fibrillation, rather than resume Coumadin. This was based on the fact that she suffered a RLE DVT in November, while on Coumadin. Of note, however, our records indicated that she presented to the hospital with an INR of 1.7, when the diagnosis of DVT was made.  Allergies  Allergen Reactions  . Codeine     REACTION: "feels like walking outside her body", itching  . Erythromycin     REACTION: hives, sob, diff. breathing  . Zolpidem Tartrate     REACTION: keeps her awake, shaking    Current Outpatient Prescriptions  Medication Sig Dispense Refill    . ADVAIR DISKUS 250-50 MCG/DOSE AEPB Inhale 1 puff into the lungs Twice daily.      Marland Kitchen albuterol (PROVENTIL) (5 MG/ML) 0.5% nebulizer solution Inhale 1 continuous puffing into the lungs as needed. For shortness of breath      . ALPRAZolam (XANAX) 1 MG tablet Take 1 tablet by mouth Three times daily as needed. For anxiety      . digoxin (LANOXIN) 0.25 MG tablet Take 1 tablet (250 mcg total) by mouth daily.  90 tablet  3  . diltiazem (CARDIZEM CD) 120 MG 24 hr capsule Take 120 mg by mouth daily.        Marland Kitchen enoxaparin (LOVENOX) 60 MG/0.6ML SOLN Inject 0.45 mLs (45 mg total) into the skin every 12 (twelve) hours.  16.8 mL  0  . fexofenadine (ALLEGRA) 180 MG tablet Take 180 mg by mouth daily.        . Fluticasone-Salmeterol (ADVAIR) 250-50 MCG/DOSE AEPB Inhale 1 puff into the lungs 2 (two) times daily.  1 each  1  . furosemide (LASIX) 20 MG tablet Take 1 tablet by mouth Twice daily.      . insulin lispro (HUMALOG) 100 UNIT/ML injection Inject 4 Units into the skin daily as needed. For high blood glucose      . lansoprazole (PREVACID) 30 MG capsule Take 1 capsule by mouth Daily.      . metoCLOPramide (REGLAN) 10 MG tablet Take 5 mg by mouth 4 (four) times daily.        Marland Kitchen morphine (MSIR) 15 MG tablet Take 1 tablet (15 mg  total) by mouth 2 (two) times daily as needed. For pain  30 tablet  0  . oxyCODONE-acetaminophen (PERCOCET) 5-325 MG per tablet Take 1 tablet by mouth 3 (three) times daily as needed. For pain      . PRISTIQ 50 MG 24 hr tablet Take 1 tablet by mouth Daily.      Marland Kitchen SINGULAIR 10 MG tablet Take 1 tablet by mouth Daily.      Marland Kitchen SPIRIVA HANDIHALER 18 MCG inhalation capsule Place 1 puff into inhaler and inhale Daily.      Marland Kitchen tiotropium (SPIRIVA) 18 MCG inhalation capsule Place 1 capsule (18 mcg total) into inhaler and inhale daily.  30 capsule  1    Past Medical History  Diagnosis Date  . COPD (chronic obstructive pulmonary disease)     CO2 retention, pulmonary fibrosis  . Hypertension   .  Diabetes mellitus   . Atrial fibrillation 2008  . Pulmonary embolism 2012  . DVT (deep venous thrombosis) unk  . Atrial fibrillation 2008    Paroxysmal, Coumadin  . Pneumonia   . Pulmonary fibrosis   . Ejection fraction     EF 55-60%, echo, December, 2012  . Lung mass     right hilar - 4.5.4.4cm 09/05/11 CT  . Warfarin anticoagulation   . Pericardial effusion     December, 2012    History   Social History  . Marital Status: Widowed    Spouse Name: N/A    Number of Children: N/A  . Years of Education: N/A   Occupational History  . Not on file.   Social History Main Topics  . Smoking status: Current Some Day Smoker  . Smokeless tobacco: Not on file  . Alcohol Use:   . Drug Use:   . Sexually Active:    Other Topics Concern  . Not on file   Social History Narrative  . No narrative on file    No family history on file.  ROS: no nausea, vomiting; no fever, chills; no melena, hematochezia; no claudication  PHYSICAL EXAM:  There were no vitals taken for this visit. GENERAL: 32 yof, chronically ill appearing; NAD HEENT: NCAT, PERRLA, EOMI; nasa cannula present NECK: palpable bilateral carotid pulses, no bruits; no JVD; no TM LUNGS: Diminished breath sounds throughout CARDIAC: RRR (S1, S2); no significant murmurs; no rubs or gallops ABDOMEN: soft, non-tender; intact BS EXTREMETIES: intact distal pulses; no significant peripheral edema SKIN: warm/dry; no obvious rash/lesions MUSCULOSKELETAL: no joint deformity NEURO: no focal deficit; NL affect   EKG: reviewed and available in Electronic Records   ASSESSMENT & PLAN:  The patient had DVT in November, 2012 with an INR of 1.7. She does have a hypercoagulable state with her cancer. Currently she is on Lovenox. I do not have any data concerning the safety of using any of the new anticoagulants in patients with brain metastases. I feel that the oncology team will be the most expert in trying to decide what  anticoagulation therapy would be best for the patient at this time.  Jerral Bonito, MD

## 2011-10-07 NOTE — Assessment & Plan Note (Signed)
We will assist in establishing the patient with the medical oncology team, here in [place].

## 2011-10-07 NOTE — Assessment & Plan Note (Signed)
No further workup recommended, at this point in time.

## 2011-10-07 NOTE — Assessment & Plan Note (Signed)
Patient remains in NSR, as she was during her recent hospitalization. With respect to anticoagulation, she was discharged on Lovenox, per recommendation by her physicians at [facility]. She had been on Coumadin, prior to the diagnosis of pericardial tamponade, and it was noted that she had been recently diagnosed with DVT, on Coumadin. Therefore, the recommendation was to treat with Lovenox long-term, rather than resume Coumadin. The issue of cost with Lovenox was raised today, and the family raised the question of whether or not the patient can resume Coumadin, or perhaps be place on one of the newer agents. Dr Olena Leatherwood recently suggested Xarelto as a possible subsitute. From our standpoint, the patient clearly is at increased risk for a repeat thromboembolic event, secondary to hypercoagulable state due to the cancer. However, our recommendation is that this decision be deferred to her medical oncologist team, given their expertise in treating cancer patients requiring anticoagulation therapy. No other cardiac recommendations at this point in time, and we will check f/u post hospital labs with a CBC and a BMET.

## 2011-10-07 NOTE — Assessment & Plan Note (Signed)
The patient has been diagnosed with metastatic lung cancer with brain metastasis, and is undergoing XRT for the lung, at [facility], with plans for 1 single treatment to the brain, scheduled for later this month.

## 2011-10-09 ENCOUNTER — Ambulatory Visit: Payer: Self-pay | Admitting: *Deleted

## 2011-10-10 ENCOUNTER — Encounter: Payer: Self-pay | Admitting: *Deleted

## 2011-10-10 ENCOUNTER — Ambulatory Visit
Admission: RE | Admit: 2011-10-10 | Discharge: 2011-10-10 | Disposition: A | Discharge status: Home / Self Care | Payer: Medicare Other | Source: Ambulatory Visit | Attending: Radiation Oncology | Admitting: Radiation Oncology

## 2011-10-10 ENCOUNTER — Ambulatory Visit: Payer: Medicare Other

## 2011-10-10 ENCOUNTER — Ambulatory Visit: Payer: Medicare Other | Admitting: Radiation Oncology

## 2011-10-10 DIAGNOSIS — C719 Malignant neoplasm of brain, unspecified: Secondary | ICD-10-CM

## 2011-10-10 DIAGNOSIS — C3490 Malignant neoplasm of unspecified part of unspecified bronchus or lung: Secondary | ICD-10-CM | POA: Insufficient documentation

## 2011-10-10 DIAGNOSIS — C7931 Secondary malignant neoplasm of brain: Secondary | ICD-10-CM

## 2011-10-10 MED ORDER — GADOBENATE DIMEGLUMINE 529 MG/ML IV SOLN
8.0000 mL | Freq: Once | INTRAVENOUS | Status: AC | PRN
  Administered 2011-10-10: 8 mL via INTRAVENOUS

## 2011-10-10 MED ORDER — SODIUM CHLORIDE 0.9 % IJ SOLN
10.0000 mL | Freq: Once | INTRAMUSCULAR | Status: AC
  Administered 2011-10-10: 10 mL via INTRAVENOUS
  Filled 2011-10-10: qty 10

## 2011-10-10 NOTE — Progress Notes (Signed)
MRI today head  Results in  Pt has  Right upper lobe mass = Lung cancer metastaic to the Brain Oxygen dependent 3 liters n/c=today 94%      Allergies;Codeine= itching,,Erythromycin=hive,sosb, diff breathing,Ambien = shaking, keeps her awake

## 2011-10-10 NOTE — Progress Notes (Signed)
IV insertion right ventral wrist region.  Brisk blood return.  Flushed several times with sterile saline without any swelling nor pain. Jalene Mullet, Specialty Procedures Navigator as witness.  Site secured with op-site covering and tape.  To simulation at 2:55pm.

## 2011-10-11 ENCOUNTER — Other Ambulatory Visit: Payer: Medicare Other | Admitting: Radiation Oncology

## 2011-10-11 ENCOUNTER — Other Ambulatory Visit: Payer: Medicare Other

## 2011-10-11 ENCOUNTER — Ambulatory Visit: Payer: Medicare Other | Admitting: Radiation Oncology

## 2011-10-13 NOTE — Progress Notes (Signed)
  Radiation Oncology         [phone] ________________________________  Name: Kristen Keller MRN: [id number]  Date: 10/13/2011  DOB: Nov 04, 1950  SIMULATION AND TREATMENT PLANNING NOTE  DIAGNOSIS:  Brain metastases  NARRATIVE:  The patient was brought to the CT Simulation planning suite.  Identity was confirmed.  All relevant records and images related to the planned course of therapy were reviewed.  The patient freely provided informed written consent to proceed with treatment after reviewing the details related to the planned course of therapy. The consent form was witnessed and verified by the simulation staff. Intravenous access was established for contrast administration. Then, the patient was set-up in a stable reproducible supine position for radiation therapy.  A relocatable thermoplastic stereotactic head frame was fabricated for precise immobilization.  CT images were obtained.  Surface markings were placed.  The CT images were loaded into the planning software and fused with the patient's targeting MRI scan.  Then the target and avoidance structures were contoured.  Treatment planning then occurred.  The radiation prescription was entered and confirmed.  I have requested 3D planning  I have requested a DVH of the following structures: Brain stem, brain, left eye, right I, lenses, optic chiasm, target volumes, uninvolved brain, and normal tissue.    PLAN:  The patient will receive 20 Gy in 1 fraction.  ________________________________  Artist Pais Kathrynn Running, M.D.

## 2011-10-14 ENCOUNTER — Other Ambulatory Visit: Payer: Self-pay | Admitting: Radiation Oncology

## 2011-10-17 ENCOUNTER — Ambulatory Visit
Admission: RE | Admit: 2011-10-17 | Discharge: 2011-10-17 | Disposition: A | Discharge status: Home / Self Care | Payer: Medicare Other | Source: Ambulatory Visit | Attending: Radiation Oncology | Admitting: Radiation Oncology

## 2011-10-17 ENCOUNTER — Encounter: Payer: Self-pay | Admitting: Radiation Oncology

## 2011-10-17 VITALS — BP 110/74 | HR 110 | Temp 98.4°F | Resp 20

## 2011-10-17 DIAGNOSIS — C7949 Secondary malignant neoplasm of other parts of nervous system: Secondary | ICD-10-CM | POA: Insufficient documentation

## 2011-10-17 DIAGNOSIS — C719 Malignant neoplasm of brain, unspecified: Secondary | ICD-10-CM

## 2011-10-17 DIAGNOSIS — Z510 Encounter for antineoplastic radiation therapy: Secondary | ICD-10-CM | POA: Insufficient documentation

## 2011-10-17 DIAGNOSIS — C3490 Malignant neoplasm of unspecified part of unspecified bronchus or lung: Secondary | ICD-10-CM | POA: Insufficient documentation

## 2011-10-17 DIAGNOSIS — C7931 Secondary malignant neoplasm of brain: Secondary | ICD-10-CM | POA: Insufficient documentation

## 2011-10-17 NOTE — Progress Notes (Signed)
Pt given information that was requested, she will need to get in touch with [place] for original diagnosis, ok to leave per Dr. Kathrynn Running, pt discharged  via w/c with family at side,. Voiced no c/o just wanted to get home  Before it snowed  3:42 PM

## 2011-10-17 NOTE — Progress Notes (Signed)
Pt meds updated, pts vitals wnl,  No nausea, blurred vision or head ache stated, pt will need signed  letter stating actual date she was diagnosed, with cancer, December at [facility] if possible, per pt request, and to fax letter with information to [phone], with attention to Herby Abraham, per [name],  will recheck vitals in 30 minutes 2:26 PM

## 2011-10-18 ENCOUNTER — Encounter: Payer: Self-pay | Admitting: Radiation Oncology

## 2011-10-18 NOTE — Progress Notes (Signed)
  Radiation Oncology         [phone] ________________________________   Stereotactic Treatment Procedure Note  Name: Kristen Keller MRN: [id number]  Date: 10/18/2011  DOB: 1951-06-30  CC: Toma Deiters, MD, MD  Murinson, Gerarda Fraction, MD  SPECIAL TREATMENT PROCEDURE  NARRATIVE:  Kristen Keller brought to the TrueBeam stereotactic radiation treatment machine and placed supine on the CT couch. Her  head frame was applied, and She was set up for stereotactic radiosurgery.  Neurosurgery was present for the set-up and delivery  SIMULATION VERIFICATION:  In the couch zero-angle position, the patient underwent Exactrac imaging using the Brainlab system with orthogonal KV images.  These were carefully aligned and repeated to confirm treatment position for each of the isocenters.  The Exactrac snap film verification was repeated at each couch angle.  SPECIAL TREATMENT PROCEDURE: Kristen Keller received stereotactic radiosurgery to the following targets: Left temporal 13 mm target was treated using 3 Dynamic Conformal Arcs to a prescription dose of 20 Gy.  ExacTrac Snap verification was performed for each couch angle. Left cerebellar 10 mm target was treated using 4 Dynamic Conformal Arcs to a prescription dose of 20 Gy.  ExacTrac Snap verification was performed for each couch angle. 6 megavolt photons were delivered to each or using the flattening filter free he mode  STEREOTACTIC TREATMENT MANAGEMENT:  Following delivery, the patient was transported to nursing in stable condition and monitored for possible acute effects.  Vital signs were recorded BP 110/74  Pulse 110  Temp(Src) 98.4 F (36.9 C) (Oral)  Resp 20. The patient tolerated treatment without significant acute effects, and was discharged to home in stable condition.    PLAN: Follow-up in one month. ________________________________  Artist Pais. Kathrynn Running, M.D.

## 2011-10-22 ENCOUNTER — Encounter: Payer: Self-pay | Admitting: Radiation Oncology

## 2011-10-22 NOTE — Progress Notes (Signed)
  Radiation Oncology         [phone] ________________________________  Name: Kristen Keller MRN: [id number]  Date: 10/22/2011  DOB: Jan 19, 1951  End of Treatment Note  Diagnosis:   61 year old woman with 2 brain metastases from metastatic adenocarcinoma of the lung     Indication for treatment:  Palliation       Radiation treatment dates:   10/17/2011  Site/dose:    1. The patient's 13 mm left temporal brain metastasis received 20 Gray in 1 fraction 2. The patient's 10 mm left cerebellar brain metastasis received 20 Gray in 1 fraction  Beams/energy:    1. The patient's 13 mm left temporal brain metastasis was treated using three dynamic conformal arcs delivering 6 megavolt photons using the flattening filter free beam mode. The dynamic arcs were generated using the micro-multileaf collimator. The patient was set up with a thermoplastic stereotactic head cast and treatment position was verified with ExacTrac for each couch angle. 2. The patient's 10 mm left cerebellar brain metastasis was treated using four dynamic conformal arcs delivering 6 megavolt photons using the flattening filter free beam mode. The dynamic arcs were generated using the micro-multileaf collimator. The patient was set up with a thermoplastic stereotactic head cast and treatment position was verified with ExacTrac for each couch angle.  Narrative: The patient tolerated radiation treatment relatively well.   No acute complications were noted  Plan: The patient has completed radiation treatment. The patient will return to radiation oncology clinic for routine followup in one month. I advised them to call or return sooner if they have any questions or concerns related to their recovery or treatment. She will continue thoracic radiation in [place]. ________________________________  Artist Pais. Kathrynn Running, M.D.

## 2011-10-28 ENCOUNTER — Telehealth: Payer: Self-pay | Admitting: Radiation Therapy

## 2011-10-28 NOTE — Telephone Encounter (Signed)
I called [name] to relay the message about changing her Dex dose to 2mg  BID and spoke to her daughter. Kristen Keller is an IP at [facility] as of last night. Her CO2 levels were 89 in the ED from her COPD. She is very weak and tired from not sleeping or eating.  I told her daughter, [name], that I would call and let [name] know in [place]. As of right now they are just trying to keep her comfortable.

## 2011-11-01 DEATH — deceased

## 2011-11-05 NOTE — Op Note (Signed)
*   No surgery found *  2:44 PM  PATIENT:  Kristen Keller  61 y.o. female with a diagnosis of adenocarcinoma of the lung. She presented in December to the cancer center with metastatic disease to the brain. She had lesions in the cerebellar vermis and left temporal lobe. She was considered a candidate for [facility] treatment.  PRE-OPERATIVE DIAGNOSIS:  Metastatic lung cancer of the brain   POST-OPERATIVE DIAGNOSIS:  Metastatic lung cancer of the brain   PROCEDURE:  Stereotactic radiosurgery for metastatic tumors of the brain  SURGEON:  Jeanelle Dake ASSISTANTS:none  ANESTHESIA:   none  EBL:     BLOOD ADMINISTERED:none  CELL SAVER GIVEN:none       DICTATION: Mrs. Taher underwent simulation planning and had a treatment plan created with the [facility] team. This included Dr. Kathrynn Running, myself, and the radiation physicist. The plan was approved prior to her treatment. All OAR doses were well with tolerances. With Exactrac imaging we localized Mrs. Klinker to the Brainlab system. We then instituted treatment to our two targets.  Left temporal 13 mm target was treated using 3 Dynamic Conformal Arcs to a prescription dose of 20 Gy. ExacTrac Snap verification was performed for each couch angle.  Left cerebellar 10 mm target was treated using 4 Dynamic Conformal Arcs to a prescription dose of 20 Gy. ExacTrac Snap verification was performed for each couch angle.  6 megavolt photons were delivered to each or using the flattening filter free  mode   PLAN OF CARE: discharge to home after short stay in the radonc clinic  PATIENT DISPOSITION:  radiation oncology clinic   Delay start of Pharmacological VTE agent (>24hrs) due to surgical blood loss or risk of bleeding:  not applicable

## 2011-11-11 ENCOUNTER — Ambulatory Visit: Payer: Medicare Other | Admitting: Radiation Oncology

## 2011-12-06 ENCOUNTER — Ambulatory Visit: Payer: Medicare Other | Admitting: Cardiology

## 2012-09-06 IMAGING — US US THORACENTESIS
1 series · 3 of 3 positions shown · non-contrast
Comparison: None

CLINICAL DATA: left pleural effusion

ULTRASOUND GUIDED left THORACENTESIS

[Series 1: us thoracentesis · 0.30mm/px · 3 of 3 slices shown]
[im 1/3]
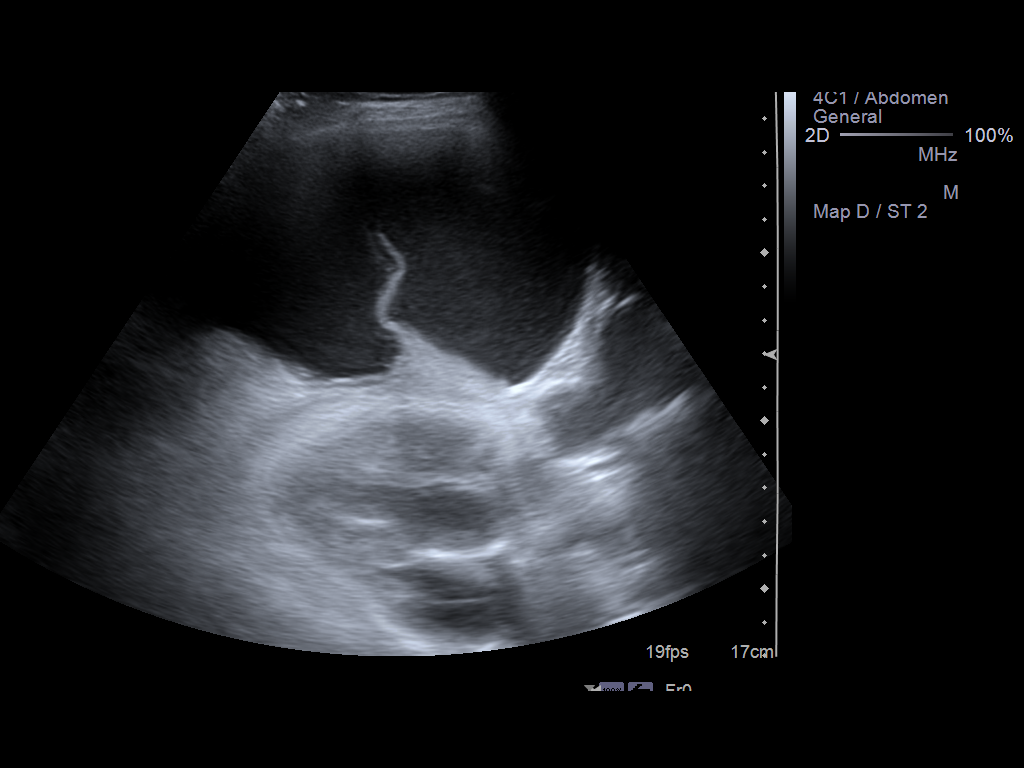
[im 2/3]
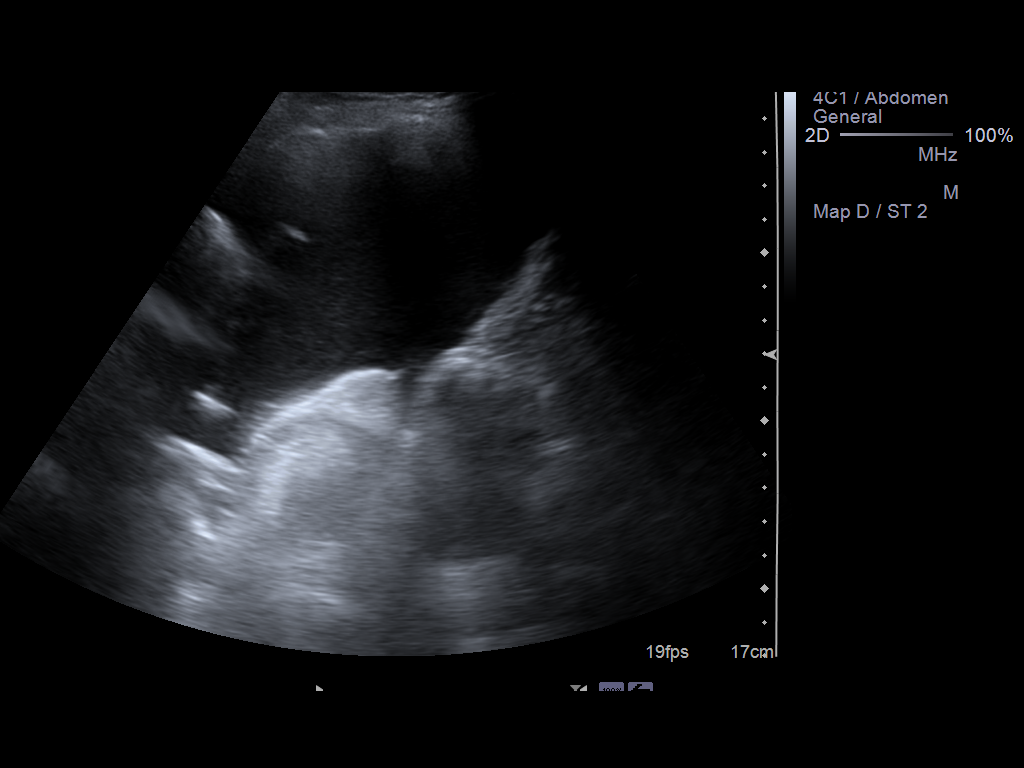
[im 3/3]
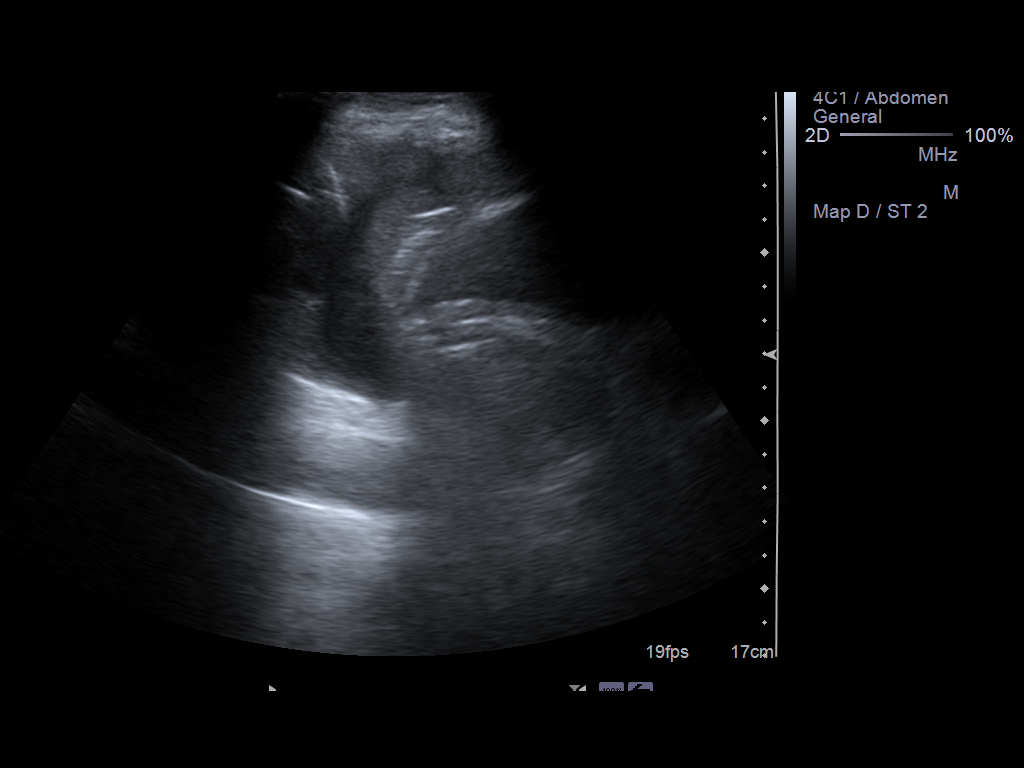

[3 of 3 positions shown; findings below may reference images not displayed]

An ultrasound guided thoracentesis was thoroughly discussed with
the patient and questions answered.  The benefits, risks,
alternatives and complications were also discussed.  The patient
understands and wishes to proceed with the procedure.  Written
consent was obtained.

Ultrasound was performed to localize and mark an adequate pocket of
fluid in the left chest.  The area was then prepped and draped in
the normal sterile fashion.  1% Lidocaine was used for local
anesthesia.  Under ultrasound guidance a 19 gauge Yueh catheter was
introduced.  Thoracentesis was performed.  The catheter was removed
and a dressing applied.

Complications:  None
FINDINGS: A total of approximately 500 ml of bloody fluid was
removed. A fluid sample was sent for laboratory analysis.
IMPRESSION: Successful ultrasound guided left thoracentesis yielding 500 ml of
pleural fluid.

Read by: Alqan, Nauhadh.-FRANTZ

## 2012-09-06 IMAGING — CR DG CHEST 1V
1 series · 1 of 1 positions shown · non-contrast
Comparison: None.

CLINICAL DATA: Lung carcinoma.  Status post left thoracentesis.

CHEST - 1 VIEW

[w chest pa]
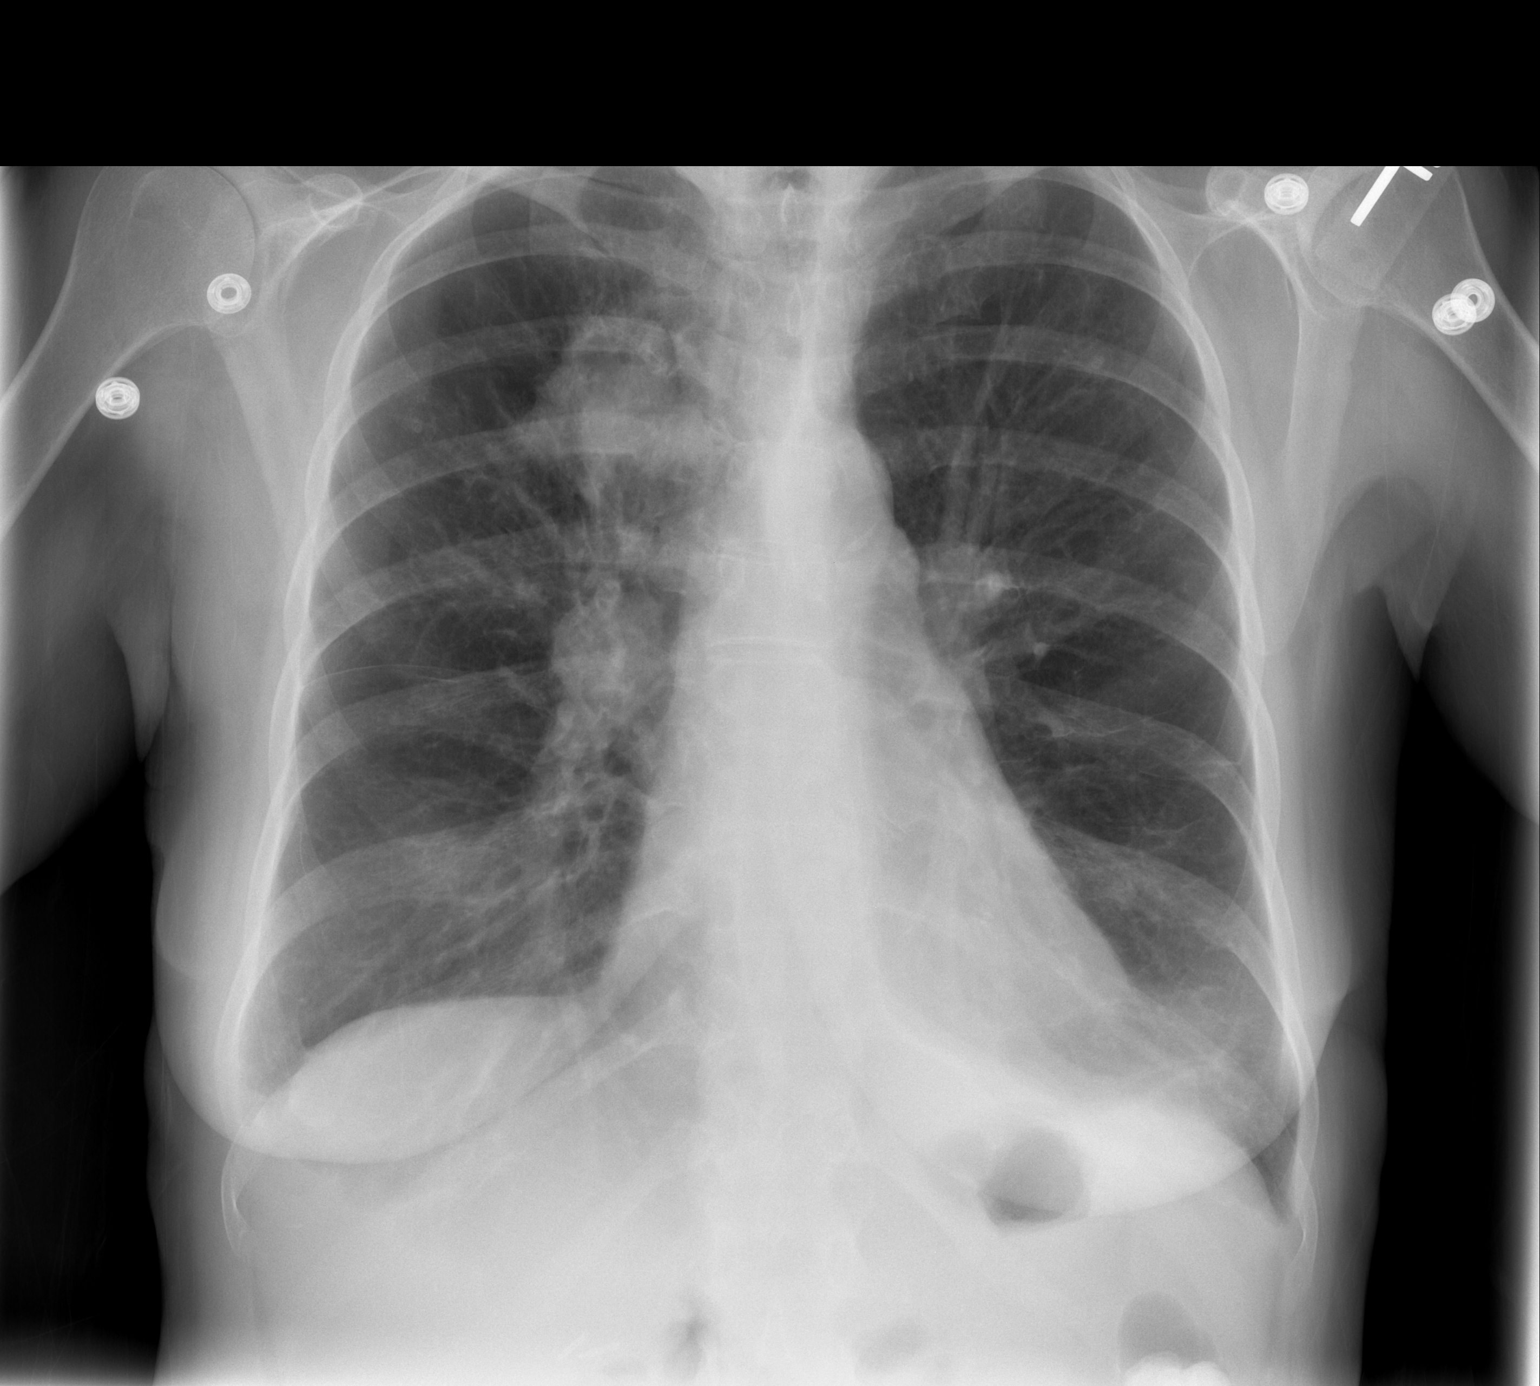

[1 of 1 positions shown; findings below may reference images not displayed]

FINDINGS: Left pleural effusion has nearly completely resolved.  No
pneumothorax identified.  Decreased atelectasis is seen in the left
lung base.  Tiny right pleural effusion or pleural thickening
remains stable.  Mass like opacity is again seen in the medial
right upper lobe. Asymmetric right hilar soft tissue prominence
again seen, suspicious for right hilar lymphadenopathy.  Heart size
remains normal.
IMPRESSION: 1.  Near complete resolution of left pleural effusion.  No
pneumothorax identified.
2.  Stable right upper lobe mass and probable aright hilar
lymphadenopathy.

## 2015-05-19 ENCOUNTER — Encounter: Payer: Self-pay | Admitting: Cardiovascular Disease

## 2015-06-27 NOTE — Progress Notes (Signed)
This encounter was created in error - please disregard.
